# Patient Record
Sex: Female | Born: 1980 | Race: White | Hispanic: No | Marital: Married | Smoking: Current some day smoker
Health system: Southern US, Community
[De-identification: ages and names within clinical notes are randomized; demographics above are authoritative.]

## PROBLEM LIST (undated history)

## (undated) DIAGNOSIS — B009 Herpesviral infection, unspecified: Secondary | ICD-10-CM

## (undated) DIAGNOSIS — D249 Benign neoplasm of unspecified breast: Secondary | ICD-10-CM

## (undated) DIAGNOSIS — R87629 Unspecified abnormal cytological findings in specimens from vagina: Secondary | ICD-10-CM

## (undated) DIAGNOSIS — R61 Generalized hyperhidrosis: Secondary | ICD-10-CM

## (undated) DIAGNOSIS — R87619 Unspecified abnormal cytological findings in specimens from cervix uteri: Secondary | ICD-10-CM

## (undated) DIAGNOSIS — Z1371 Encounter for nonprocreative screening for genetic disease carrier status: Secondary | ICD-10-CM

## (undated) DIAGNOSIS — Z9189 Other specified personal risk factors, not elsewhere classified: Secondary | ICD-10-CM

## (undated) DIAGNOSIS — K219 Gastro-esophageal reflux disease without esophagitis: Secondary | ICD-10-CM

## (undated) DIAGNOSIS — Z803 Family history of malignant neoplasm of breast: Secondary | ICD-10-CM

## (undated) HISTORY — DX: Generalized hyperhidrosis: R61

## (undated) HISTORY — DX: Other specified personal risk factors, not elsewhere classified: Z91.89

## (undated) HISTORY — DX: Unspecified abnormal cytological findings in specimens from cervix uteri: R87.619

## (undated) HISTORY — DX: Herpesviral infection, unspecified: B00.9

## (undated) HISTORY — DX: Family history of malignant neoplasm of breast: Z80.3

## (undated) HISTORY — DX: Benign neoplasm of unspecified breast: D24.9

## (undated) HISTORY — DX: Encounter for nonprocreative screening for genetic disease carrier status: Z13.71

---

## 2005-12-07 ENCOUNTER — Ambulatory Visit: Payer: Self-pay

## 2006-10-31 ENCOUNTER — Ambulatory Visit: Payer: Self-pay | Admitting: Gynecologic Oncology

## 2007-01-14 ENCOUNTER — Emergency Department: Payer: Self-pay | Admitting: Emergency Medicine

## 2008-01-14 ENCOUNTER — Inpatient Hospital Stay: Payer: Self-pay

## 2012-07-11 DIAGNOSIS — Z1371 Encounter for nonprocreative screening for genetic disease carrier status: Secondary | ICD-10-CM

## 2012-07-11 HISTORY — DX: Encounter for nonprocreative screening for genetic disease carrier status: Z13.71

## 2013-11-29 ENCOUNTER — Ambulatory Visit: Payer: Self-pay

## 2013-12-06 ENCOUNTER — Encounter: Payer: Self-pay | Admitting: General Surgery

## 2013-12-25 ENCOUNTER — Other Ambulatory Visit: Payer: 59

## 2013-12-25 ENCOUNTER — Encounter: Payer: Self-pay | Admitting: General Surgery

## 2013-12-25 ENCOUNTER — Ambulatory Visit (INDEPENDENT_AMBULATORY_CARE_PROVIDER_SITE_OTHER): Payer: 59 | Admitting: General Surgery

## 2013-12-25 VITALS — BP 120/68 | HR 74 | Resp 12 | Ht 68.0 in | Wt 141.0 lb

## 2013-12-25 DIAGNOSIS — N63 Unspecified lump in unspecified breast: Secondary | ICD-10-CM

## 2013-12-25 HISTORY — PX: BREAST BIOPSY: SHX20

## 2013-12-25 NOTE — Patient Instructions (Signed)
Patient to return in 1 week for nurse visit. 6 month follow up with MD. The patient is aware to call back for any questions or concerns.     CARE AFTER BREAST BIOPSY  1. Leave the dressing on that your doctor applied after surgery. It is waterproof. You may bathe, shower and/or swim. The dressing will probably remain intact until your return office visit. If the dressing comes off, you will see Abhay Godbolt strips of tape against your skin on the incision. Do not remove these strips.  2. You may want to use a gauze,cloth or similar protection in your bra to prevent rubbing against your dressing and incision. This is not necessary, but you may feel more comfortable doing so.  3. It is recommended that you wear a bra day and night to give support to the breast. This will prevent the weight of the breast from pulling on the incision.  4. Your breast will feel hard and lumpy under the incision. Do not be alarmed. This is the underlying stitching of tissue. Softening of this tissue will occur in time.  5. Make sure you call the office and schedule an appointment in one week after your surgery. The office phone number is [phone]. The nurses [facility] may have already done this for you.  6. You will notice about a week after your office visit that the strips of the tape on your incision will begin to loosen. These may then be removed.  7. Report to your doctor any of the following:  * Severe pain not relieved by your pain medication  *Redness of the incision  * Drainage from the incision  *Fever greater than 101 degrees

## 2013-12-25 NOTE — Progress Notes (Signed)
Patient ID: Lynn Nguyen, female   DOB: 04-Jul-1981, 33 y.o.   MRN: [id number]  Chief Complaint  Patient presents with  . Other    Abnormal mammogram     HPI Lynn Nguyen is a 33 y.o. female who presents for a breast evaluation. The most recent mammogram and right breast ultrasound was done on 11/29/13. Patient does perform regular self breast checks. This was her first mammogram done. She has a family history of breast cancer. She noticed the lump approximately 1 month ago . She states her breast are tender and the area has stayed the same size since first noticing it.    HPI  History reviewed. No pertinent past medical history.  History reviewed. No pertinent past surgical history.  Family History  Problem Relation Age of Onset  . Cancer Maternal Grandmother 6    breast  . Cancer Paternal Grandmother     ovarian    Social History History  Substance Use Topics  . Smoking status: Current Every Day Smoker -- 1.00 packs/day for 10 years    Types: Cigarettes  . Smokeless tobacco: Not on file  . Alcohol Use: No    No Known Allergies  No current outpatient prescriptions on file.   No current facility-administered medications for this visit.    Review of Systems Review of Systems  Constitutional: Negative.   Respiratory: Negative.   Cardiovascular: Negative.     Blood pressure 120/68, pulse 74, resp. rate 12, height 5\' 8"  (1.727 m), weight 141 lb (63.957 kg), last menstrual period 11/22/2013.  Physical Exam Physical Exam  Constitutional: She is oriented to person, place, and time. She appears well-developed and well-nourished.  Eyes: Conjunctivae are normal. No scleral icterus.  Neck: Neck supple. No thyromegaly present.  Pulmonary/Chest: Right breast exhibits mass (1 cm mobile mass located at 1 o'clock smooth 3 cm from nipple.). Right breast exhibits no inverted nipple, no nipple discharge, no skin change and no tenderness. Left breast exhibits no inverted  nipple, no mass, no nipple discharge, no skin change and no tenderness.  Lymphadenopathy:    She has no cervical adenopathy.    She has no axillary adenopathy.  Neurological: She is alert and oriented to person, place, and time.  Skin: Skin is warm and dry.    Data Reviewed Mammogram-nl, dense breasts. [place]- hypoechoic mass, likely a fibroadenoma.  Assessment    Right breast mass, likely a fibroadenoma.     Plan    Discussed options with pt. She is very concerned because of FH of breast cancer. Agreeable to core biopsy. Completed today.        SANKAR,SEEPLAPUTHUR G 12/25/2013, 12:52 PM

## 2013-12-26 ENCOUNTER — Telehealth: Payer: Self-pay | Admitting: *Deleted

## 2013-12-26 LAB — PATHOLOGY

## 2013-12-26 NOTE — Telephone Encounter (Signed)
Notified patient as instructed, fibroadenoma per Dr Jamal Collin, patient pleased. Discussed follow-up appointments, patient agrees

## 2014-01-01 ENCOUNTER — Ambulatory Visit (INDEPENDENT_AMBULATORY_CARE_PROVIDER_SITE_OTHER): Payer: 59 | Admitting: *Deleted

## 2014-01-01 DIAGNOSIS — N63 Unspecified lump in unspecified breast: Secondary | ICD-10-CM

## 2014-01-01 NOTE — Progress Notes (Signed)
Patient here today for follow up post right breast biopsy.  No dressing.. Minimal bruising noted.  The patient is aware that a heating pad may be used for comfort as needed.  Aware of pathology. Follow up as scheduled.

## 2014-01-01 NOTE — Patient Instructions (Signed)
Continue self breast exams. Call office for any new breast issues or concerns. 

## 2014-05-09 LAB — OB RESULTS CONSOLE GC/CHLAMYDIA
CHLAMYDIA, DNA PROBE: NEGATIVE
CHLAMYDIA, DNA PROBE: NEGATIVE
Chlamydia: NEGATIVE
Chlamydia: NEGATIVE
GC PROBE AMP, GENITAL: NEGATIVE
Gonorrhea: NEGATIVE
Gonorrhea: NEGATIVE
Gonorrhea: NEGATIVE

## 2014-05-09 LAB — OB RESULTS CONSOLE HIV ANTIBODY (ROUTINE TESTING)
HIV: NONREACTIVE
HIV: NONREACTIVE
HIV: NONREACTIVE
HIV: NONREACTIVE
HIV: NONREACTIVE

## 2014-05-09 LAB — OB RESULTS CONSOLE RPR
RPR: NONREACTIVE
RPR: NONREACTIVE
RPR: NONREACTIVE
RPR: NONREACTIVE
RPR: NONREACTIVE

## 2014-05-09 LAB — OB RESULTS CONSOLE RUBELLA ANTIBODY, IGM: Rubella: NON-IMMUNE/NOT IMMUNE

## 2014-05-09 LAB — OB RESULTS CONSOLE VARICELLA ZOSTER ANTIBODY, IGG: Varicella: IMMUNE

## 2014-05-09 LAB — OB RESULTS CONSOLE HEPATITIS B SURFACE ANTIGEN: HEP B S AG: NEGATIVE

## 2014-05-09 LAB — OB RESULTS CONSOLE ABO/RH: RH Type: POSITIVE

## 2014-05-12 ENCOUNTER — Encounter: Payer: Self-pay | Admitting: General Surgery

## 2014-06-25 ENCOUNTER — Ambulatory Visit: Payer: Self-pay | Admitting: General Surgery

## 2014-07-11 NOTE — L&D Delivery Note (Signed)
Delivery Note At 1:40 PM a viable female was delivered via Vaginal, Spontaneous Delivery (Presentation: vertex, LOA) APGAR: 9, 10;  Weight:   8#  9.6 Oz       Grams: [id number]  Placenta status: Intact, Spontaneous.  Cord: 3 vessels with the following complications: none.   Anesthesia: Epidural  Episiotomy: None Lacerations: 1st degree;Periurethral Suture Repair: 2.0 vicryl Est. Blood Loss (mL): 250 Infants name: Naida Sleight   Mom to postpartum.  Baby to Couplet care / Skin to Skin.  SVD of a live viable female infant. After delivery, the infant was placed on the maternal abdomen where the cord was clamped and cut by the fob after pulsation ceased. Apgars were 8/9. The placenta was then delivered via schultz mechanism and was intact with a 3-vessel cord. Pitocin was then started and the uterus was firm with fundal massage. The perineum was then repaired under epidural anesthesia with a 2-0 vicryl and was hemostatic after repair. There was also a periurethral tear that was also repaired with a 2-0 vicryl and was hemostatic. EBL was ~250. Mother and baby are bonding well. Mother is planning to breastfeed.   Louisa Second 11/21/2014, 2:35 PM

## 2014-07-17 ENCOUNTER — Encounter: Payer: Self-pay | Admitting: General Surgery

## 2014-07-17 ENCOUNTER — Ambulatory Visit (INDEPENDENT_AMBULATORY_CARE_PROVIDER_SITE_OTHER): Payer: 59 | Admitting: General Surgery

## 2014-07-17 ENCOUNTER — Other Ambulatory Visit: Payer: 59

## 2014-07-17 VITALS — BP 122/58 | HR 62 | Resp 12 | Ht 68.0 in | Wt 156.0 lb

## 2014-07-17 DIAGNOSIS — D241 Benign neoplasm of right breast: Secondary | ICD-10-CM

## 2014-07-17 DIAGNOSIS — N6310 Unspecified lump in the right breast, unspecified quadrant: Secondary | ICD-10-CM

## 2014-07-17 DIAGNOSIS — Z803 Family history of malignant neoplasm of breast: Secondary | ICD-10-CM

## 2014-07-17 DIAGNOSIS — N63 Unspecified lump in breast: Secondary | ICD-10-CM

## 2014-07-17 NOTE — Progress Notes (Signed)
Patient ID: Lynn Nguyen, female   DOB: Jun 28, 1981, 34 y.o.   MRN: [id number]  Chief Complaint  Patient presents with  . Follow-up    6 month post op right breast biopsy    HPI Lynn Nguyen is a 34 y.o. female who presents for a 6 month post op right breast biopsy showing fibroadenoma. She states she is 5 months pregnant. No new breast issues. The patient had BRCA testing completed at [facility] in May 2014 which was negative. Her sister recently diagnosed with breast cancer-pt unsure of details.   HPI  History reviewed. No pertinent past medical history.  Past Surgical History  Procedure Laterality Date  . Breast biopsy Right 12/25/13    Family History  Problem Relation Age of Onset  . Cancer Maternal Grandmother 60    breast  . Cancer Paternal Grandmother     ovarian  . Cancer Maternal Aunt 28    breast  . Cancer Sister 37    breast    Social History History  Substance Use Topics  . Smoking status: Current Every Day Smoker -- 1.00 packs/day for 10 years    Types: Cigarettes  . Smokeless tobacco: Not on file  . Alcohol Use: No    No Known Allergies  Current Outpatient Prescriptions  Medication Sig Dispense Refill  . Prenatal Vit-Fe Fumarate-FA (MULTIVITAMIN-PRENATAL) 27-0.8 MG TABS tablet Take 1 tablet by mouth daily at 12 noon.     No current facility-administered medications for this visit.    Review of Systems Review of Systems  Constitutional: Negative.   Respiratory: Negative.   Cardiovascular: Negative.     Blood pressure 122/58, pulse 62, resp. rate 12, height 5' 8"  (1.727 m), weight 156 lb (70.761 kg), last menstrual period 01/17/2014.  Physical Exam Physical Exam  Constitutional: She is oriented to person, place, and time. She appears well-developed and well-nourished.  Eyes: Conjunctivae are normal. No scleral icterus.  Neck: Neck supple.  Pulmonary/Chest: Right breast exhibits no inverted nipple, no mass, no nipple discharge,  no skin change and no tenderness. Left breast exhibits no inverted nipple, no mass, no nipple discharge, no skin change and no tenderness.  Lymphadenopathy:    She has no cervical adenopathy.    She has no axillary adenopathy.  Neurological: She is alert and oriented to person, place, and time.  Skin: Skin is warm and dry.    Data Reviewed Previous ultrasound reviewed. Repeat US of right breast shows a lobulated hypoechoic mass right breast 1 ocl 3cm fn. Appears unchanged from before.  Assessment    Fibroadenoma right breast. FH of breast cancer.     Plan    Follow up in 6 mos. Call for any new problems with her breasts.        SANKAR,SEEPLAPUTHUR G 07/17/2014, 2:10 PM

## 2014-07-17 NOTE — Patient Instructions (Signed)
Continue self breast exams. Call office for any new breast issues or concerns. 

## 2014-10-24 LAB — OB RESULTS CONSOLE GBS: GBS: NEGATIVE

## 2014-11-20 ENCOUNTER — Inpatient Hospital Stay
Admission: EM | Admit: 2014-11-20 | Discharge: 2014-11-22 | DRG: 774 | Disposition: A | Discharge status: Home / Self Care | Payer: 59 | Attending: Obstetrics & Gynecology | Admitting: Obstetrics & Gynecology

## 2014-11-20 DIAGNOSIS — O9852 Other viral diseases complicating childbirth: Secondary | ICD-10-CM | POA: Diagnosis present

## 2014-11-20 DIAGNOSIS — Z87891 Personal history of nicotine dependence: Secondary | ICD-10-CM | POA: Diagnosis not present

## 2014-11-20 DIAGNOSIS — B009 Herpesviral infection, unspecified: Secondary | ICD-10-CM | POA: Diagnosis present

## 2014-11-20 DIAGNOSIS — Z3A40 40 weeks gestation of pregnancy: Secondary | ICD-10-CM | POA: Diagnosis present

## 2014-11-20 DIAGNOSIS — Z3493 Encounter for supervision of normal pregnancy, unspecified, third trimester: Secondary | ICD-10-CM

## 2014-11-20 DIAGNOSIS — Z3483 Encounter for supervision of other normal pregnancy, third trimester: Secondary | ICD-10-CM | POA: Diagnosis present

## 2014-11-20 DIAGNOSIS — Z3490 Encounter for supervision of normal pregnancy, unspecified, unspecified trimester: Secondary | ICD-10-CM

## 2014-11-20 DIAGNOSIS — O480 Post-term pregnancy: Principal | ICD-10-CM | POA: Diagnosis present

## 2014-11-20 HISTORY — DX: Unspecified abnormal cytological findings in specimens from vagina: R87.629

## 2014-11-20 MED ORDER — LACTATED RINGERS IV SOLN
INTRAVENOUS | Status: DC
  Administered 2014-11-20: 23:00:00 via INTRAVENOUS
  Administered 2014-11-21: 125 mL/h via INTRAVENOUS

## 2014-11-20 MED ORDER — ONDANSETRON HCL 4 MG/2ML IJ SOLN: 4.0000 mg | Freq: Four times a day (QID) | INTRAMUSCULAR | Status: DC | PRN

## 2014-11-20 MED ORDER — ZOLPIDEM TARTRATE 5 MG PO TABS: 5.0000 mg | ORAL_TABLET | Freq: Every evening | ORAL | Status: DC | PRN

## 2014-11-20 MED ORDER — ACETAMINOPHEN 325 MG PO TABS: 650.0000 mg | ORAL_TABLET | ORAL | Status: DC | PRN

## 2014-11-20 MED ORDER — OXYTOCIN BOLUS FROM INFUSION: 500.0000 mL | INTRAVENOUS | Status: DC

## 2014-11-20 MED ORDER — TERBUTALINE SULFATE 1 MG/ML IJ SOLN: 0.2500 mg | Freq: Once | INTRAMUSCULAR | Status: AC | PRN

## 2014-11-20 MED ORDER — LIDOCAINE HCL (PF) 1 % IJ SOLN
30.0000 mL | INTRAMUSCULAR | Status: AC | PRN
  Administered 2014-11-21: 3 mL via SUBCUTANEOUS

## 2014-11-20 MED ORDER — LACTATED RINGERS IV SOLN: 500.0000 mL | INTRAVENOUS | Status: DC | PRN

## 2014-11-20 MED ORDER — BUTORPHANOL TARTRATE 1 MG/ML IJ SOLN: 1.0000 mg | INTRAMUSCULAR | Status: DC | PRN

## 2014-11-20 MED ORDER — CITRIC ACID-SODIUM CITRATE 334-500 MG/5ML PO SOLN: 30.0000 mL | ORAL | Status: DC | PRN

## 2014-11-20 MED ORDER — DINOPROSTONE 10 MG VA INST
10.0000 mg | VAGINAL_INSERT | Freq: Once | VAGINAL | Status: AC
  Administered 2014-11-20: 10 mg via VAGINAL
  Filled 2014-11-20: qty 1

## 2014-11-20 MED ORDER — OXYTOCIN 40 UNITS IN LACTATED RINGERS INFUSION - SIMPLE MED: 62.5000 mL/h | INTRAVENOUS | Status: DC

## 2014-11-20 NOTE — H&P (Signed)
OB History & Physical   History of Present Illness:  Chief Complaint:   HPI:  Lynn Nguyen is a 34 y.o. G2P1 female at [redacted]w[redacted]d dated by 1st trimester ultrasound.  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  She presents to L&D for induction of labor for past due date.    Rare CTX, no VB, no LOF and ++FM  Maternal Medical History:   Past Medical History  Diagnosis Date  . Vaginal Pap smear, abnormal     Past Surgical History  Procedure Laterality Date  . Breast biopsy Right 12/25/13    No Known Allergies  Prior to Admission medications   Medication Sig Start Date End Date Taking? Authorizing Provider  Prenatal Vit-Fe Fumarate-FA (MULTIVITAMIN-PRENATAL) 27-0.8 MG TABS tablet Take 1 tablet by mouth daily at 12 noon.   Yes Historical Provider, MD  valACYclovir (VALTREX) 500 MG tablet Take 500 mg by mouth 2 (two) times daily.   Yes Historical Provider, MD    OB History  Gravida Para Term Preterm AB SAB TAB Ectopic Multiple Living  2 1        1     # Outcome Date GA Lbr Len/2nd Weight Sex Delivery Anes PTL Lv  2 Current           1 Para             Obstetric Comments  1st Menstrual Cycle:  14  1st Pregnancy:  27    [facility]ren[facility] care [facility]: [facility]  Social History: She  reports that she has quit smoking. Her smoking use included Cigarettes. She has a 10 pack-year smoking history. She does not have any smokeless tobacco history on file. She reports that she does not drink alcohol or use illicit drugs.  Family History: family history includes Cancer in her paternal grandmother; Cancer (age of onset: 38) in her sister; Cancer (age of onset: 100) in her maternal grandmother; Cancer (age of onset: 51) in her maternal aunt.   Review of Systems: Negative x 10 systems reviewed except as noted in the HPI.     Physical Exam:  Vital Signs: BP 107/73 mmHg  Pulse 95  Temp(Src) 97.9 F (36.6 C) (Oral)  Resp 18  Ht 5\' 7"  (1.702 m)  Wt 81.194 kg (179 lb)   BMI 28.03 kg/m2  LMP 01/17/2014 General: no acute distress.  HEENT: normocephalic, atraumatic Heart: regular rate & rhythm.  No murmurs/rubs/gallops Lungs: clear to auscultation bilaterally Abdomen: soft, gravid, non-tender Pelvic:   External: Normal external female genitalia  Cervix: Dilation: 1.5 / Effacement (%): 50 / Station: -3  Extremities: non-tender, symmetric, 1+ edema bilaterally.  DTRs: 2+ Neurologic: Alert & oriented x 3.    Pertinent Results:  Prenatal Labs: Blood type/Rh A+  Antibody screen neg  Rubella Non-immune, varicella immune  RPR Non-reactive  HBsAg neg  HIV neg  GC neg  Chlamydia neg  Genetic screening   1 hour GTT   3 hour GTT   GBS neg   Baseline FHR: 125 beats    Variability: moderate  Accelerations: present  Decelerations: absent Contractions: present frequency: rare Overall assessment: category 1   Assessment:  Lynn Nguyen is a 34 y.o. G2P1 female at [redacted]w[redacted]d with IOL for past due date.   Plan:  1. Admit to Labor & Delivery - notify attending & anesthesia.   2. CBC, T&S, Clrs, IVF 3. GBS neg 4. Consents obtained. 5. Cervidil induction  Stepheni Cameron, Honor Loh  11/20/2014 10:37 PM

## 2014-11-21 ENCOUNTER — Inpatient Hospital Stay: Payer: 59 | Admitting: Registered Nurse

## 2014-11-21 ENCOUNTER — Encounter: Payer: Self-pay | Admitting: *Deleted

## 2014-11-21 LAB — TYPE AND SCREEN
ABO/RH(D): A POS
Antibody Screen: NEGATIVE

## 2014-11-21 LAB — CBC
HCT: 35 % (ref 35.0–47.0)
Hemoglobin: 11.9 g/dL — ABNORMAL LOW (ref 12.0–16.0)
MCH: 32.7 pg (ref 26.0–34.0)
MCHC: 34 g/dL (ref 32.0–36.0)
MCV: 96.2 fL (ref 80.0–100.0)
PLATELETS: 253 10*3/uL (ref 150–440)
RBC: 3.64 MIL/uL — AB (ref 3.80–5.20)
RDW: 13.3 % (ref 11.5–14.5)
WBC: 10.8 10*3/uL (ref 3.6–11.0)

## 2014-11-21 LAB — ABO/RH: ABO/RH(D): A POS

## 2014-11-21 MED ORDER — LANOLIN HYDROUS EX OINT: TOPICAL_OINTMENT | CUTANEOUS | Status: DC | PRN

## 2014-11-21 MED ORDER — BUPIVACAINE HCL (PF) 0.25 % IJ SOLN
INTRAMUSCULAR | Status: AC | PRN
  Administered 2014-11-21 (×2): 4 mL

## 2014-11-21 MED ORDER — FENTANYL 2.5 MCG/ML W/ROPIVACAINE 0.2% IN NS 100 ML EPIDURAL INFUSION (ARMC-ANES)
9.0000 mL/h | EPIDURAL | Status: DC
  Administered 2014-11-21: 9 mL/h via EPIDURAL

## 2014-11-21 MED ORDER — MEASLES, MUMPS & RUBELLA VAC ~~LOC~~ INJ
0.5000 mL | INJECTION | SUBCUTANEOUS | Status: AC | PRN
  Administered 2014-11-22: 0.5 mL via SUBCUTANEOUS
  Filled 2014-11-21: qty 0.5

## 2014-11-21 MED ORDER — WITCH HAZEL-GLYCERIN EX PADS: 1.0000 "application " | MEDICATED_PAD | CUTANEOUS | Status: DC | PRN

## 2014-11-21 MED ORDER — EPHEDRINE 5 MG/ML INJ
10.0000 mg | INTRAVENOUS | Status: DC | PRN
  Filled 2014-11-21: qty 2

## 2014-11-21 MED ORDER — IBUPROFEN 600 MG PO TABS
600.0000 mg | ORAL_TABLET | Freq: Four times a day (QID) | ORAL | Status: DC
  Administered 2014-11-22 (×3): 600 mg via ORAL
  Filled 2014-11-21 (×3): qty 1

## 2014-11-21 MED ORDER — BENZOCAINE-MENTHOL 20-0.5 % EX AERO: 1.0000 "application " | INHALATION_SPRAY | CUTANEOUS | Status: DC | PRN

## 2014-11-21 MED ORDER — ZOLPIDEM TARTRATE 5 MG PO TABS: 5.0000 mg | ORAL_TABLET | Freq: Every evening | ORAL | Status: DC | PRN

## 2014-11-21 MED ORDER — PRENATAL MULTIVITAMIN CH
1.0000 | ORAL_TABLET | Freq: Every day | ORAL | Status: DC
  Administered 2014-11-22: 1 via ORAL
  Filled 2014-11-21: qty 1

## 2014-11-21 MED ORDER — SENNOSIDES-DOCUSATE SODIUM 8.6-50 MG PO TABS: 2.0000 | ORAL_TABLET | ORAL | Status: DC

## 2014-11-21 MED ORDER — BENZOCAINE-MENTHOL 20-0.5 % EX AERO
INHALATION_SPRAY | CUTANEOUS | Status: AC
  Filled 2014-11-21: qty 56

## 2014-11-21 MED ORDER — OXYTOCIN 40 UNITS IN LACTATED RINGERS INFUSION - SIMPLE MED: 62.5000 mL/h | Freq: Once | INTRAVENOUS | Status: DC

## 2014-11-21 MED ORDER — ONDANSETRON HCL 4 MG PO TABS: 4.0000 mg | ORAL_TABLET | ORAL | Status: DC | PRN

## 2014-11-21 MED ORDER — FENTANYL 2.5 MCG/ML W/ROPIVACAINE 0.2% IN NS 100 ML EPIDURAL INFUSION (ARMC-ANES)
EPIDURAL | Status: AC
  Filled 2014-11-21: qty 100

## 2014-11-21 MED ORDER — OXYCODONE-ACETAMINOPHEN 5-325 MG PO TABS: 1.0000 | ORAL_TABLET | ORAL | Status: DC | PRN

## 2014-11-21 MED ORDER — PHENYLEPHRINE 40 MCG/ML (10ML) SYRINGE FOR IV PUSH (FOR BLOOD PRESSURE SUPPORT)
80.0000 ug | PREFILLED_SYRINGE | INTRAVENOUS | Status: DC | PRN
  Filled 2014-11-21: qty 2

## 2014-11-21 MED ORDER — OXYCODONE-ACETAMINOPHEN 5-325 MG PO TABS: 2.0000 | ORAL_TABLET | ORAL | Status: DC | PRN

## 2014-11-21 MED ORDER — TETANUS-DIPHTH-ACELL PERTUSSIS 5-2.5-18.5 LF-MCG/0.5 IM SUSP: 0.5000 mL | Freq: Once | INTRAMUSCULAR | Status: DC

## 2014-11-21 MED ORDER — DIPHENHYDRAMINE HCL 25 MG PO CAPS: 25.0000 mg | ORAL_CAPSULE | Freq: Four times a day (QID) | ORAL | Status: DC | PRN

## 2014-11-21 MED ORDER — SENNOSIDES-DOCUSATE SODIUM 8.6-50 MG PO TABS
ORAL_TABLET | ORAL | Status: AC
Start: 2014-11-21 — End: 2014-11-22
  Filled 2014-11-21: qty 2

## 2014-11-21 MED ORDER — ACETAMINOPHEN 325 MG PO TABS
650.0000 mg | ORAL_TABLET | ORAL | Status: DC | PRN
  Administered 2014-11-21: 650 mg via ORAL
  Filled 2014-11-21: qty 2

## 2014-11-21 MED ORDER — OXYTOCIN 40 UNITS IN LACTATED RINGERS INFUSION - SIMPLE MED
INTRAVENOUS | Status: AC
  Filled 2014-11-21: qty 1000

## 2014-11-21 MED ORDER — ONDANSETRON HCL 4 MG/2ML IJ SOLN: 4.0000 mg | INTRAMUSCULAR | Status: DC | PRN

## 2014-11-21 MED ORDER — DIPHENHYDRAMINE HCL 50 MG/ML IJ SOLN: 12.5000 mg | INTRAMUSCULAR | Status: DC | PRN

## 2014-11-21 MED ORDER — DIBUCAINE 1 % RE OINT: 1.0000 "application " | TOPICAL_OINTMENT | RECTAL | Status: DC | PRN

## 2014-11-21 MED ORDER — SIMETHICONE 80 MG PO CHEW: 80.0000 mg | CHEWABLE_TABLET | ORAL | Status: DC | PRN

## 2014-11-21 MED ORDER — LIDOCAINE-EPINEPHRINE (PF) 1.5 %-1:200000 IJ SOLN
INTRAMUSCULAR | Status: AC | PRN
  Administered 2014-11-21: 3 mL

## 2014-11-21 NOTE — Anesthesia Preprocedure Evaluation (Signed)
Anesthesia Evaluation  Patient identified by MRN, date of birth, ID band Patient awake    Reviewed: Allergy & Precautions, H&P , NPO status , Patient's Chart, lab work & pertinent test results  History of Anesthesia Complications (+) history of anesthetic complications  Airway Mallampati: II  TM Distance: >3 FB     Dental no notable dental hx.    Pulmonary former smoker,    Pulmonary exam normal       Cardiovascular negative cardio ROS Normal cardiovascular exam    Neuro/Psych negative neurological ROS  negative psych ROS   GI/Hepatic negative GI ROS, Neg liver ROS,   Endo/Other  negative endocrine ROS  Renal/GU negative Renal ROS  negative genitourinary   Musculoskeletal   Abdominal   Peds  Hematology negative hematology ROS (+)   Anesthesia Other Findings   Reproductive/Obstetrics (+) Pregnancy                             Anesthesia Physical Anesthesia Plan  ASA: II  Anesthesia Plan: Epidural   Post-op Pain Management:    Induction:   Airway Management Planned:   Additional Equipment:   Intra-op Plan:   Post-operative Plan:   Informed Consent:   Plan Discussed with: Anesthesiologist  Anesthesia Plan Comments:         Anesthesia Quick Evaluation

## 2014-11-21 NOTE — Anesthesia Procedure Notes (Addendum)
Epidural Patient location during procedure: OB Start time: 11/21/2014 11:59 AM End time: 11/21/2014 12:09 PM  Staffing Resident/CRNA: Doreen Salvage Performed by: resident/CRNA   Preanesthetic Checklist Completed: patient identified, surgical consent, pre-op evaluation, timeout performed, IV checked, risks and benefits discussed and monitors and equipment checked  Epidural Patient position: sitting Prep: Betadine and 1202 Patient monitoring: continuous pulse ox and blood pressure Approach: midline Location: L3-L4 Injection technique: LOR saline  Needle:  Needle type: Tuohy  Needle gauge: 18 G Needle length: 9 cm Needle insertion depth: 5.5 cm Catheter type: closed end flexible Catheter size: 20 Guage Catheter at skin depth: 10 cm Test dose: negative and 1.5% lidocaine with Epi 1:200 K  Assessment Sensory level: T10 Events: blood not aspirated, injection not painful, no injection resistance, negative IV test and no paresthesia  Additional Notes Reason for block:procedure for pain

## 2014-11-22 LAB — CBC
HEMATOCRIT: 33.5 % — AB (ref 35.0–47.0)
HEMOGLOBIN: 11.5 g/dL — AB (ref 12.0–16.0)
MCH: 32.7 pg (ref 26.0–34.0)
MCHC: 34.4 g/dL (ref 32.0–36.0)
MCV: 95 fL (ref 80.0–100.0)
PLATELETS: 243 10*3/uL (ref 150–440)
RBC: 3.52 MIL/uL — AB (ref 3.80–5.20)
RDW: 13.9 % (ref 11.5–14.5)
WBC: 20.7 10*3/uL — ABNORMAL HIGH (ref 3.6–11.0)

## 2014-11-22 LAB — HIV ANTIBODY (ROUTINE TESTING W REFLEX): HIV Screen 4th Generation wRfx: NONREACTIVE

## 2014-11-22 LAB — RPR: RPR Ser Ql: NONREACTIVE

## 2014-11-22 MED ORDER — MEDROXYPROGESTERONE ACETATE 150 MG/ML IM SUSP
150.0000 mg | Freq: Once | INTRAMUSCULAR | Status: AC
  Administered 2014-11-22: 150 mg via INTRAMUSCULAR
  Filled 2014-11-22: qty 1

## 2014-11-22 MED ORDER — TETANUS-DIPHTH-ACELL PERTUSSIS 5-2.5-18.5 LF-MCG/0.5 IM SUSP
0.5000 mL | Freq: Once | INTRAMUSCULAR | Status: DC
Start: 2014-11-23 — End: 2014-11-22

## 2014-11-22 MED ORDER — IBUPROFEN 600 MG PO TABS: 600.0000 mg | ORAL_TABLET | Freq: Four times a day (QID) | ORAL | Status: DC

## 2014-11-22 NOTE — Discharge Summary (Signed)
Obstetric Discharge Summary Reason for Admission: induction of labor Intrapartum Procedures: spontaneous vaginal delivery Postpartum Procedures: none Complications-Operative and Postpartum: none HEMOGLOBIN  Date Value Ref Range Status  11/22/2014 11.5* 12.0 - 16.0 g/dL Final   HCT  Date Value Ref Range Status  11/22/2014 33.5* 35.0 - 47.0 % Final    Physical Exam:  General: alert Lochia: appropriate Uterine Fundus: firm Incision: laceration healing well DVT Evaluation: No evidence of DVT seen on physical exam. Abdomen: abdomen is soft without significant tenderness, masses, organomegaly or guarding  Discharge Diagnoses: Term Pregnancy-delivered  Discharge Information: Date: 11/22/2014 Activity: unrestricted Diet: routine Medications: PNV and Ibuprofen Condition: stable Instructions: refer to practice specific booklet Discharge to: home Follow-up Information    Follow up with Louisa Second, CNM. Schedule an appointment as soon as possible for a visit in 6 weeks.   Specialty:  Certified Nurse Midwife   Why:  postpartum visit   Contact information:   [place] [address] [phone]       Newborn Data: Live born female  Birth Weight: 8 lb 9.6 oz (3900 g) APGAR: 9, 10  Home with mother.  Lynn Nguyen, [place] 11/22/2014, 2:01 PM

## 2014-11-22 NOTE — Progress Notes (Signed)
pt discharged home with infant.  Discharge instructions, prescriptions and follow up appointment given to and reviewed with pt.  Pt verbalized understanding, all questions answered.  Escorted by auxiliary. 

## 2014-11-22 NOTE — Progress Notes (Signed)
Fresh ice pack for swelling

## 2014-11-22 NOTE — Discharge Instructions (Signed)
Discharge instructions:   Call office if you have any of the following: headache, visual changes, fever >100 F, chills, breast concerns, excessive vaginal bleeding, incision drainage or problems, leg pain or redness, depression or any other concerns.   Activity: Do not lift > 10 lbs for 6 weeks.  No intercourse or tampons for 6 weeks.  No driving for 1-2 weeks.

## 2015-01-22 ENCOUNTER — Ambulatory Visit: Payer: 59 | Admitting: General Surgery

## 2015-07-12 DIAGNOSIS — Z9189 Other specified personal risk factors, not elsewhere classified: Secondary | ICD-10-CM

## 2015-07-12 HISTORY — DX: Other specified personal risk factors, not elsewhere classified: Z91.89

## 2016-03-07 ENCOUNTER — Encounter: Payer: Self-pay | Admitting: *Deleted

## 2016-03-10 ENCOUNTER — Encounter: Payer: Self-pay | Admitting: General Surgery

## 2016-03-10 ENCOUNTER — Ambulatory Visit (INDEPENDENT_AMBULATORY_CARE_PROVIDER_SITE_OTHER): Payer: 59 | Admitting: General Surgery

## 2016-03-10 ENCOUNTER — Other Ambulatory Visit: Payer: Self-pay

## 2016-03-10 VITALS — BP 104/62 | HR 86 | Resp 14 | Ht 67.0 in | Wt 177.0 lb

## 2016-03-10 DIAGNOSIS — Z803 Family history of malignant neoplasm of breast: Secondary | ICD-10-CM

## 2016-03-10 DIAGNOSIS — D241 Benign neoplasm of right breast: Secondary | ICD-10-CM | POA: Diagnosis not present

## 2016-03-10 NOTE — Patient Instructions (Addendum)
The patient is aware to call back for any questions or concerns. Patient will be asked to return to the office in one year with a bilateral screening mammogram and office ultrasound.

## 2016-03-10 NOTE — Progress Notes (Signed)
Patient ID: Lynn Nguyen, female   DOB: Jan 20, 1981, 35 y.o.   MRN: [id number]  Chief Complaint  Patient presents with  . Follow-up    HPI PHYLLISS Nguyen is a 35 y.o. female.  Here today for follow up right breast biopsy done last year. No new breast issues. Occasional mild tenderness at biopsy site but it goes away. The patient had BRCA testing completed at [facility] in May 2014 which was negative I have reviewed the history of present illness with the patient.  HPI  Past Medical History:  Diagnosis Date  . BRCA negative 2014  . Vaginal Pap smear, abnormal     Past Surgical History:  Procedure Laterality Date  . BREAST BIOPSY Right 12/25/13    Family History  Problem Relation Age of Onset  . Breast cancer Maternal Grandmother 60    breast  . Ovarian cancer Paternal Grandmother     ovarian  . Breast cancer Maternal Aunt 12    breast  . Breast cancer Sister 82    breast    Social History Social History  Substance Use Topics  . Smoking status: Former Smoker    Packs/day: 1.00    Years: 10.00    Types: Cigarettes  . Smokeless tobacco: Never Used  . Alcohol use No    No Known Allergies  Current Outpatient Prescriptions  Medication Sig Dispense Refill  . ibuprofen (ADVIL,MOTRIN) 600 MG tablet Take 1 tablet (600 mg total) by mouth every 6 (six) hours. (Patient taking differently: Take 600 mg by mouth every 6 (six) hours as needed. ) 30 tablet 0   No current facility-administered medications for this visit.    Facility-Administered Medications Ordered in Other Visits  Medication Dose Route Frequency Provider Last Rate Last Dose  . bupivacaine (PF) (MARCAINE) 0.25 % injection    Anesthesia Intra-op Doreen Salvage, CRNA   4 mL at 11/21/14 1218  . lidocaine-EPINEPHrine 1.5 %-1:200000 injection    Anesthesia Intra-op Doreen Salvage, CRNA   3 mL at 11/21/14 1210    Review of Systems Review of Systems  Constitutional: Negative.   Respiratory: Negative.    Cardiovascular: Negative.     Blood pressure 104/62, pulse 86, resp. rate 14, height 5' 7"  (1.702 m), weight 177 lb (80.3 kg), unknown if currently breastfeeding.  Physical Exam Physical Exam  Constitutional: She is oriented to person, place, and time. She appears well-developed and well-nourished.  HENT:  Mouth/Throat: Oropharynx is clear and moist.  Eyes: Conjunctivae are normal. No scleral icterus.  Neck: Neck supple.  Cardiovascular: Normal rate, regular rhythm and normal heart sounds.   Pulmonary/Chest: Effort normal and breath sounds normal. Right breast exhibits no inverted nipple, no mass, no nipple discharge, no skin change and no tenderness. Left breast exhibits no inverted nipple, no mass, no nipple discharge, no skin change and no tenderness.  Abdominal: Soft. Normal appearance. There is no tenderness.  Lymphadenopathy:    She has no cervical adenopathy.    She has no axillary adenopathy.  Neurological: She is alert and oriented to person, place, and time.  Skin: Skin is warm and dry.  Psychiatric: Her behavior is normal.    Data Reviewed Progress notes. [place] of right breast at 1ocl 4cm from nipple performed. Prior mass is again seen with max size of 1.9cm, was 1.8 before. Assessment    Fibroadenoma right breast. Essentially unchanged in size.  FH of breast cancer. BRCA negative    Plan    Patient will be asked  to return to the office in one year with a bilateral screening mammogram and office ultrasound.       This information has been scribed by Lynn Fetch RN, BSN,BC.  SANKAR,Lynn Nguyen 03/16/2016, 7:52 AM

## 2016-03-16 ENCOUNTER — Encounter: Payer: Self-pay | Admitting: General Surgery

## 2016-09-16 ENCOUNTER — Ambulatory Visit (INDEPENDENT_AMBULATORY_CARE_PROVIDER_SITE_OTHER): Payer: 59

## 2016-09-16 DIAGNOSIS — Z308 Encounter for other contraceptive management: Secondary | ICD-10-CM

## 2016-09-16 MED ORDER — MEDROXYPROGESTERONE ACETATE 150 MG/ML IM SUSP
150.0000 mg | Freq: Once | INTRAMUSCULAR | Status: AC
  Administered 2016-09-16: 150 mg via INTRAMUSCULAR

## 2016-09-16 NOTE — Progress Notes (Signed)
Pt came in to office today for Depo Provera injection. Last injection was 07/01/2016.

## 2016-12-09 ENCOUNTER — Ambulatory Visit (INDEPENDENT_AMBULATORY_CARE_PROVIDER_SITE_OTHER): Payer: 59

## 2016-12-09 DIAGNOSIS — Z3042 Encounter for surveillance of injectable contraceptive: Secondary | ICD-10-CM | POA: Diagnosis not present

## 2016-12-09 MED ORDER — MEDROXYPROGESTERONE ACETATE 150 MG/ML IM SUSP
150.0000 mg | Freq: Once | INTRAMUSCULAR | Status: AC
  Administered 2016-12-09: 150 mg via INTRAMUSCULAR

## 2016-12-22 ENCOUNTER — Other Ambulatory Visit: Payer: Self-pay

## 2016-12-22 DIAGNOSIS — Z1231 Encounter for screening mammogram for malignant neoplasm of breast: Secondary | ICD-10-CM

## 2017-02-17 ENCOUNTER — Ambulatory Visit
Admission: RE | Admit: 2017-02-17 | Discharge: 2017-02-17 | Disposition: A | Discharge status: Home / Self Care | Payer: 59 | Source: Ambulatory Visit | Attending: General Surgery | Admitting: General Surgery

## 2017-02-17 DIAGNOSIS — Z1231 Encounter for screening mammogram for malignant neoplasm of breast: Secondary | ICD-10-CM | POA: Diagnosis not present

## 2017-02-23 ENCOUNTER — Encounter: Payer: Self-pay | Admitting: General Surgery

## 2017-02-23 ENCOUNTER — Inpatient Hospital Stay: Payer: Self-pay

## 2017-02-23 ENCOUNTER — Ambulatory Visit (INDEPENDENT_AMBULATORY_CARE_PROVIDER_SITE_OTHER): Payer: 59 | Admitting: General Surgery

## 2017-02-23 VITALS — BP 106/60 | HR 68 | Resp 14 | Ht 67.0 in | Wt 178.8 lb

## 2017-02-23 DIAGNOSIS — Z803 Family history of malignant neoplasm of breast: Secondary | ICD-10-CM | POA: Diagnosis not present

## 2017-02-23 DIAGNOSIS — D241 Benign neoplasm of right breast: Secondary | ICD-10-CM | POA: Diagnosis not present

## 2017-02-23 NOTE — Progress Notes (Signed)
Patient ID: Lynn Nguyen, female   DOB: 1981-01-10, 36 y.o.   MRN: [id number]  Chief Complaint  Patient presents with  . Follow-up    mammogram    HPI Lynn Nguyen is a 36 y.o. female who presents for a breast evaluation. The most recent mammogram was done on 02/17/17. Patient does perform regular self breast checks and gets regular mammograms done. She does report some stinging sensation in her right breast at her previous biopsy site that occurs sometimes.    HPI  Past Medical History:  Diagnosis Date  . BRCA negative 2014  . Vaginal Pap smear, abnormal     Past Surgical History:  Procedure Laterality Date  . BREAST BIOPSY Right 12/25/13   neg    Family History  Problem Relation Age of Onset  . Breast cancer Maternal Grandmother 60       breast  . Ovarian cancer Paternal Grandmother        ovarian  . Breast cancer Maternal Aunt 46       breast  . Breast cancer Sister 72       breast    Social History Social History  Substance Use Topics  . Smoking status: Former Smoker    Packs/day: 1.00    Years: 10.00    Types: Cigarettes  . Smokeless tobacco: Never Used  . Alcohol use No    No Known Allergies  Current Outpatient Prescriptions  Medication Sig Dispense Refill  . ibuprofen (ADVIL,MOTRIN) 600 MG tablet Take 1 tablet (600 mg total) by mouth every 6 (six) hours. (Patient taking differently: Take 600 mg by mouth every 6 (six) hours as needed. ) 30 tablet 0  . MedroxyPROGESTERone Acetate 150 MG/ML SUSY      No current facility-administered medications for this visit.    Facility-Administered Medications Ordered in Other Visits  Medication Dose Route Frequency Provider Last Rate Last Dose  . bupivacaine (PF) (MARCAINE) 0.25 % injection    Anesthesia Intra-op Doreen Salvage, CRNA   4 mL at 11/21/14 1218  . lidocaine-EPINEPHrine 1.5 %-1:200000 injection    Anesthesia Intra-op Doreen Salvage, CRNA   3 mL at 11/21/14 1210    Review of Systems Review of  Systems  Constitutional: Negative.   Respiratory: Negative.   Cardiovascular: Negative.     Blood pressure 106/60, pulse 68, resp. rate 14, height 5' 7"  (1.702 m), weight 178 lb 12.8 oz (81.1 kg), unknown if currently breastfeeding.  Physical Exam Physical Exam  Constitutional: She is oriented to person, place, and time. She appears well-developed and well-nourished.  Eyes: Conjunctivae are normal. No scleral icterus.  Neck: Neck supple.  Cardiovascular: Normal rate, regular rhythm and normal heart sounds.   Pulmonary/Chest: Effort normal and breath sounds normal. Right breast exhibits no inverted nipple, no mass, no nipple discharge, no skin change and no tenderness. Left breast exhibits no inverted nipple, no mass, no nipple discharge, no skin change and no tenderness.  Abdominal: Soft. Normal appearance.  Lymphadenopathy:    She has no cervical adenopathy.    She has no axillary adenopathy.  Neurological: She is alert and oriented to person, place, and time.  Skin: Skin is warm and dry.  Psychiatric: She has a normal mood and affect.    Data Reviewed Mammogram-Cat 1 Previous notes  Right breast ultrasound targeted to the 1:00 location 4 cm from the nipple again shows the hypoechoic mass. Current measurement is at 2.17 cm which is larger than the initial finding of 1.7 Assessment  Fibroadenoma in the right breast which appears to be slowly growing. Family history of breast cancer. Patient is BRCA negative    Plan    Given the slow enlargement of the fibroadenoma excision was recommended and procedure explained to the patient. Patient is agreeable    HPI, Physical Exam, Assessment and Plan have been scribed under the direction and in the presence of Mckinley Jewel, MD  Concepcion Living, LPN I have completed the exam and reviewed the above documentation for accuracy and completeness.  I agree with the above.  Haematologist has been used and any errors in dictation or  transcription are unintentional.  Seanpatrick Maisano G. Jamal Collin, M.D., F.A.C.S.    Junie Panning G 02/27/2017, 8:53 AM  Patient was provided with possible surgery dates for September. She wishes to check her schedule and call the office back to arrange. Surgery instructions were reviewed and provided to the patient today.  Dominga Ferry, CMA

## 2017-02-23 NOTE — Patient Instructions (Addendum)
Schedule breast excisional biopsy

## 2017-03-01 ENCOUNTER — Ambulatory Visit (INDEPENDENT_AMBULATORY_CARE_PROVIDER_SITE_OTHER): Payer: 59

## 2017-03-01 DIAGNOSIS — Z308 Encounter for other contraceptive management: Secondary | ICD-10-CM

## 2017-03-01 MED ORDER — MEDROXYPROGESTERONE ACETATE 150 MG/ML IM SUSP
150.0000 mg | Freq: Once | INTRAMUSCULAR | Status: AC
  Administered 2017-03-01: 150 mg via INTRAMUSCULAR

## 2017-03-01 NOTE — Progress Notes (Signed)
Pt here for depo which was given IM right glut.  NDC# [address]-2 

## 2017-03-08 ENCOUNTER — Other Ambulatory Visit: Payer: Self-pay | Admitting: General Surgery

## 2017-03-08 DIAGNOSIS — D241 Benign neoplasm of right breast: Secondary | ICD-10-CM

## 2017-03-16 ENCOUNTER — Encounter
Admission: RE | Admit: 2017-03-16 | Discharge: 2017-03-16 | Disposition: A | Discharge status: Home / Self Care | Payer: 59 | Source: Ambulatory Visit | Attending: General Surgery | Admitting: General Surgery

## 2017-03-16 HISTORY — DX: Gastro-esophageal reflux disease without esophagitis: K21.9

## 2017-03-17 NOTE — Patient Instructions (Signed)
Your procedure is scheduled on: 03-23-17 Report to Same Day Surgery 2nd floor medical mall [facility] Entrance-take elevator on left to 2nd floor.  Check in with surgery information desk.) To find out your arrival time please call [phone] between 1PM - 3PM on 03-22-17  Remember: Instructions that are not followed completely may result in serious medical risk, up to and including death, or upon the discretion of your surgeon and anesthesiologist your surgery may need to be rescheduled.    _x___ 1. Do not eat food after midnight the night before your procedure. You may drink clear liquids up to 2 hours before you are scheduled to arrive at the hospital for your procedure.  Do not drink clear liquids within 2 hours of your scheduled arrival to the hospital.  Clear liquids include  --Water or Apple juice without pulp  --Clear carbohydrate beverage such as ClearFast or Gatorade  --Black Coffee or Clear Tea (No milk, no creamers, do not add anything to                  the coffee or Tea Type 1 and type 2 diabetics should only drink water.  No gum chewing or hard candies.     __x__ 2. No Alcohol for 24 hours before or after surgery.   __x__3. No Smoking for 24 prior to surgery.   ____  4. Bring all medications with you on the day of surgery if instructed.    __x__ 5. Notify your doctor if there is any change in your medical condition     (cold, fever, infections).     Do not wear jewelry, make-up, hairpins, clips or nail polish.  Do not wear lotions, powders, or perfumes. You may wear deodorant.  Do not shave 48 hours prior to surgery. Men may shave face and neck.  Do not bring valuables to the hospital.    [facility] is not responsible for any belongings or valuables.               Contacts, dentures or bridgework may not be worn into surgery.  Leave your suitcase in the car. After surgery it may be brought to your room.  For patients admitted to the hospital, discharge time is  determined by your                       treatment team.   Patients discharged the day of surgery will not be allowed to drive home.  You will need someone to drive you home and stay with you the night of your procedure.    Please read over the following fact sheets that you were given:   [facility] Preparing for Surgery and or MRSA Information   ____ Take anti-hypertensive listed below, cardiac, seizure, asthma,     anti-reflux and psychiatric medicines. These include:  1. NONE  2.  3.  4.  5.  6.  ____Fleets enema or Magnesium Citrate as directed.   ____ Use CHG Soap or sage wipes as directed on instruction sheet   ____ Use inhalers on the day of surgery and bring to hospital day of surgery  ____ Stop Metformin and Janumet 2 days prior to surgery.    ____ Take 1/2 of usual insulin dose the night before surgery and none on the morning surgery.   ____ Follow recommendations from Cardiologist, Pulmonologist or PCP regarding stopping Aspirin, Coumadin, Plavix ,Eliquis, Effient, or Pradaxa, and Pletal.  X____Stop Anti-inflammatories such  as Advil, Aleve, Ibuprofen, Motrin, Naproxen, Naprosyn, Goodies powders or aspirin products NOW-OK to take Tylenol    ____ Stop supplements until after surgery.     ____ Bring C-Pap to the hospital.

## 2017-03-23 ENCOUNTER — Encounter: Admission: RE | Payer: Self-pay | Source: Ambulatory Visit

## 2017-03-23 ENCOUNTER — Telehealth: Payer: Self-pay | Admitting: *Deleted

## 2017-03-23 ENCOUNTER — Encounter: Payer: Self-pay | Admitting: Certified Registered"

## 2017-03-23 ENCOUNTER — Ambulatory Visit: Admission: RE | Admit: 2017-03-23 | Payer: 59 | Source: Ambulatory Visit | Admitting: General Surgery

## 2017-03-23 SURGERY — BREAST LUMPECTOMY
Anesthesia: Choice | Laterality: Right
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
## 2017-03-27 ENCOUNTER — Telehealth: Payer: Self-pay | Admitting: *Deleted

## 2017-03-27 NOTE — Telephone Encounter (Signed)
Message left for patient to call the office.   Patient's surgery was moved to 04-20-17 per her request. Per Dr. Jamal Collin today, he will be taking vacation and unable to do her surgery on 04-20-17. We need to get surgery rescheduled to a different date.

## 2017-03-28 NOTE — Telephone Encounter (Signed)
Patient called back and has rescheduled her surgery to 05/04/17. She is aware to call the day before her surgery to get her arrival time. The patient is aware of date and instructions.

## 2017-04-10 DIAGNOSIS — D249 Benign neoplasm of unspecified breast: Secondary | ICD-10-CM

## 2017-04-10 HISTORY — DX: Benign neoplasm of unspecified breast: D24.9

## 2017-04-13 ENCOUNTER — Other Ambulatory Visit: Payer: 59

## 2017-04-27 ENCOUNTER — Inpatient Hospital Stay: Admission: RE | Admit: 2017-04-27 | Payer: 59 | Source: Ambulatory Visit

## 2017-04-28 ENCOUNTER — Inpatient Hospital Stay: Admission: RE | Admit: 2017-04-28 | Payer: 59 | Source: Ambulatory Visit

## 2017-05-01 ENCOUNTER — Encounter
Admission: RE | Admit: 2017-05-01 | Discharge: 2017-05-01 | Disposition: A | Discharge status: Home / Self Care | Payer: 59 | Source: Ambulatory Visit | Attending: General Surgery | Admitting: General Surgery

## 2017-05-01 NOTE — Patient Instructions (Signed)
  Your procedure is scheduled on: 05-04-17 Report to Same Day Surgery 2nd floor medical mall [facility] Entrance-take elevator on left to 2nd floor.  Check in with surgery information desk.) To find out your arrival time please call [phone] between 1PM - 3PM on 05-03-17  Remember: Instructions that are not followed completely may result in serious medical risk, up to and including death, or upon the discretion of your surgeon and anesthesiologist your surgery may need to be rescheduled.    _x___ 1. Do not eat food after midnight the night before your procedure. You may drink clear liquids up to 2 hours before you are scheduled to arrive at the hospital for your procedure.  Do not drink clear liquids within 2 hours of your scheduled arrival to the hospital.  Clear liquids include  --Water or Apple juice without pulp  --Clear carbohydrate beverage such as ClearFast or Gatorade  --Black Coffee or Clear Tea (No milk, no creamers, do not add anything to  the coffee or Tea Type 1 and type 2 diabetics should only drink water.  No gum chewing or hard candies.     __x__ 2. No Alcohol for 24 hours before or after surgery.   __x__3. No Smoking for 24 prior to surgery.   ____  4. Bring all medications with you on the day of surgery if instructed.    __x__ 5. Notify your doctor if there is any change in your medical condition     (cold, fever, infections).     Do not wear jewelry, make-up, hairpins, clips or nail polish.  Do not wear lotions, powders, or perfumes. You may wear deodorant.  Do not shave 48 hours prior to surgery. Men may shave face and neck.  Do not bring valuables to the hospital.    [facility] is not responsible for any belongings or valuables.               Contacts, dentures or bridgework may not be worn into surgery.  Leave your suitcase in the car. After surgery it may be brought to your room.  For patients admitted to the hospital, discharge time is determined by  your treatment team.   Patients discharged the day of surgery will not be allowed to drive home.  You will need someone to drive you home and stay with you the night of your procedure.    Please read over the following fact sheets that you were given:     ____ Take anti-hypertensive listed below, cardiac, seizure, asthma, anti-reflux and psychiatric medicines. These include:  1. NONE  2.  3.  4.  5.  6.  ____Fleets enema or Magnesium Citrate as directed.   _x___ Use CHG Soap or sage wipes as directed on instruction sheet   ____ Use inhalers on the day of surgery and bring to hospital day of surgery  ____ Stop Metformin and Janumet 2 days prior to surgery.    ____ Take 1/2 of usual insulin dose the night before surgery and none on the morning  surgery.   ____ Follow recommendations from Cardiologist, Pulmonologist about stopping Aspirin, Coumadin, Plavix ,Eliquis, Effient, or Pradaxa, and Pletal.  ____Stop Anti-inflammatories such as Advil, Aleve, Ibuprofen, Motrin, Naproxen, Naprosyn, Goodies powders or aspirin products.  OK to take Tylenol    ____ Stop supplements until after surgery.    ____ Bring C-Pap to the hospital.

## 2017-05-04 ENCOUNTER — Encounter
Admission: RE | Disposition: A | Discharge status: Home / Self Care | Payer: Self-pay | Source: Ambulatory Visit | Attending: General Surgery

## 2017-05-04 ENCOUNTER — Ambulatory Visit: Payer: 59 | Admitting: Anesthesiology

## 2017-05-04 ENCOUNTER — Ambulatory Visit
Admission: RE | Admit: 2017-05-04 | Discharge: 2017-05-04 | Disposition: A | Discharge status: Home / Self Care | Payer: 59 | Source: Ambulatory Visit | Attending: General Surgery | Admitting: General Surgery

## 2017-05-04 ENCOUNTER — Encounter: Payer: Self-pay | Admitting: *Deleted

## 2017-05-04 DIAGNOSIS — F17210 Nicotine dependence, cigarettes, uncomplicated: Secondary | ICD-10-CM | POA: Diagnosis not present

## 2017-05-04 DIAGNOSIS — K219 Gastro-esophageal reflux disease without esophagitis: Secondary | ICD-10-CM | POA: Diagnosis not present

## 2017-05-04 DIAGNOSIS — Z79899 Other long term (current) drug therapy: Secondary | ICD-10-CM | POA: Diagnosis not present

## 2017-05-04 DIAGNOSIS — D241 Benign neoplasm of right breast: Secondary | ICD-10-CM

## 2017-05-04 DIAGNOSIS — Z803 Family history of malignant neoplasm of breast: Secondary | ICD-10-CM | POA: Insufficient documentation

## 2017-05-04 DIAGNOSIS — Z8041 Family history of malignant neoplasm of ovary: Secondary | ICD-10-CM | POA: Insufficient documentation

## 2017-05-04 DIAGNOSIS — N6310 Unspecified lump in the right breast, unspecified quadrant: Secondary | ICD-10-CM | POA: Diagnosis present

## 2017-05-04 HISTORY — PX: BREAST LUMPECTOMY: SHX2

## 2017-05-04 LAB — POCT PREGNANCY, URINE: PREG TEST UR: NEGATIVE

## 2017-05-04 SURGERY — BREAST LUMPECTOMY
Anesthesia: General | Laterality: Right

## 2017-05-04 MED ORDER — MIDAZOLAM HCL 2 MG/2ML IJ SOLN
INTRAMUSCULAR | Status: DC | PRN
Start: 2017-05-04 — End: 2017-05-04
  Administered 2017-05-04: 2 mg via INTRAVENOUS

## 2017-05-04 MED ORDER — ONDANSETRON HCL 4 MG/2ML IJ SOLN
INTRAMUSCULAR | Status: DC | PRN
  Administered 2017-05-04: 4 mg via INTRAVENOUS

## 2017-05-04 MED ORDER — FAMOTIDINE 20 MG PO TABS
20.0000 mg | ORAL_TABLET | Freq: Once | ORAL | Status: AC
  Administered 2017-05-04: 20 mg via ORAL

## 2017-05-04 MED ORDER — BUPIVACAINE HCL (PF) 0.5 % IJ SOLN
INTRAMUSCULAR | Status: DC | PRN
  Administered 2017-05-04: 7 mL

## 2017-05-04 MED ORDER — LACTATED RINGERS IV SOLN
INTRAVENOUS | Status: DC
  Administered 2017-05-04 (×2): via INTRAVENOUS

## 2017-05-04 MED ORDER — FENTANYL CITRATE (PF) 100 MCG/2ML IJ SOLN
INTRAMUSCULAR | Status: DC | PRN
  Administered 2017-05-04: 25 ug via INTRAVENOUS
  Administered 2017-05-04: 50 ug via INTRAVENOUS
  Administered 2017-05-04: 25 ug via INTRAVENOUS

## 2017-05-04 MED ORDER — MIDAZOLAM HCL 2 MG/2ML IJ SOLN
INTRAMUSCULAR | Status: AC
  Filled 2017-05-04: qty 2

## 2017-05-04 MED ORDER — PROPOFOL 10 MG/ML IV BOLUS
INTRAVENOUS | Status: DC | PRN
  Administered 2017-05-04: 160 mg via INTRAVENOUS
  Administered 2017-05-04 (×2): 20 mg via INTRAVENOUS

## 2017-05-04 MED ORDER — PROMETHAZINE HCL 25 MG/ML IJ SOLN: 6.2500 mg | INTRAMUSCULAR | Status: DC | PRN

## 2017-05-04 MED ORDER — FENTANYL CITRATE (PF) 100 MCG/2ML IJ SOLN: 25.0000 ug | INTRAMUSCULAR | Status: DC | PRN

## 2017-05-04 MED ORDER — LIDOCAINE HCL (CARDIAC) 20 MG/ML IV SOLN
INTRAVENOUS | Status: DC | PRN
Start: 2017-05-04 — End: 2017-05-04
  Administered 2017-05-04: 40 mg via INTRAVENOUS

## 2017-05-04 MED ORDER — FAMOTIDINE 20 MG PO TABS
ORAL_TABLET | ORAL | Status: AC
  Filled 2017-05-04: qty 1

## 2017-05-04 MED ORDER — EPHEDRINE SULFATE 50 MG/ML IJ SOLN
INTRAMUSCULAR | Status: AC
  Filled 2017-05-04: qty 1

## 2017-05-04 MED ORDER — FENTANYL CITRATE (PF) 100 MCG/2ML IJ SOLN
INTRAMUSCULAR | Status: AC
  Filled 2017-05-04: qty 2

## 2017-05-04 MED ORDER — LIDOCAINE HCL (PF) 2 % IJ SOLN
INTRAMUSCULAR | Status: AC
  Filled 2017-05-04: qty 10

## 2017-05-04 MED ORDER — DEXAMETHASONE SODIUM PHOSPHATE 10 MG/ML IJ SOLN
INTRAMUSCULAR | Status: DC | PRN
  Administered 2017-05-04: 8 mg via INTRAVENOUS

## 2017-05-04 MED ORDER — CHLORHEXIDINE GLUCONATE CLOTH 2 % EX PADS: 6.0000 | MEDICATED_PAD | Freq: Once | CUTANEOUS | Status: DC

## 2017-05-04 MED ORDER — EPHEDRINE SULFATE 50 MG/ML IJ SOLN
INTRAMUSCULAR | Status: DC | PRN
Start: 2017-05-04 — End: 2017-05-04
  Administered 2017-05-04: 10 mg via INTRAVENOUS

## 2017-05-04 MED ORDER — PROPOFOL 10 MG/ML IV BOLUS
INTRAVENOUS | Status: AC
  Filled 2017-05-04: qty 20

## 2017-05-04 MED ORDER — BUPIVACAINE HCL (PF) 0.5 % IJ SOLN
INTRAMUSCULAR | Status: AC
  Filled 2017-05-04: qty 30

## 2017-05-04 MED ORDER — DEXAMETHASONE SODIUM PHOSPHATE 10 MG/ML IJ SOLN
INTRAMUSCULAR | Status: AC
  Filled 2017-05-04: qty 1

## 2017-05-04 MED ORDER — TRAMADOL HCL 50 MG PO TABS: 50.0000 mg | ORAL_TABLET | Freq: Four times a day (QID) | ORAL | 0 refills | Status: DC | PRN

## 2017-05-04 MED ORDER — SODIUM CHLORIDE 0.9 % IJ SOLN
INTRAMUSCULAR | Status: AC
  Filled 2017-05-04: qty 10

## 2017-05-04 SURGICAL SUPPLY — 31 items
BLADE SURG 15 STRL SS SAFETY (BLADE) ×6 IMPLANT
BULB RESERV EVAC DRAIN JP 100C (MISCELLANEOUS) IMPLANT
CANISTER SUCT 1200ML W/VALVE (MISCELLANEOUS) ×3 IMPLANT
CHLORAPREP W/TINT 26ML (MISCELLANEOUS) ×3 IMPLANT
CNTNR SPEC 2.5X3XGRAD LEK (MISCELLANEOUS) ×1
CONT SPEC 4OZ STER OR WHT (MISCELLANEOUS) ×2
CONTAINER SPEC 2.5X3XGRAD LEK (MISCELLANEOUS) ×1 IMPLANT
COVER PROBE FLX POLY STRL (MISCELLANEOUS) ×3 IMPLANT
DERMABOND ADVANCED (GAUZE/BANDAGES/DRESSINGS) ×2
DERMABOND ADVANCED .7 DNX12 (GAUZE/BANDAGES/DRESSINGS) ×1 IMPLANT
DEVICE LOCALIZATION ULTRAWIRE (WIRE) ×1 IMPLANT
DRAIN CHANNEL JP 15F RND 16 (MISCELLANEOUS) IMPLANT
DRAPE LAPAROTOMY TRNSV 106X77 (MISCELLANEOUS) ×3 IMPLANT
ELECT REM PT RETURN 9FT ADLT (ELECTROSURGICAL) ×3
ELECTRODE REM PT RTRN 9FT ADLT (ELECTROSURGICAL) ×1 IMPLANT
GLOVE BIO SURGEON STRL SZ7 (GLOVE) ×3 IMPLANT
GOWN STRL REUS W/ TWL LRG LVL3 (GOWN DISPOSABLE) ×3 IMPLANT
GOWN STRL REUS W/TWL LRG LVL3 (GOWN DISPOSABLE) ×6
KIT RM TURNOVER STRD PROC AR (KITS) ×3 IMPLANT
LABEL OR SOLS (LABEL) ×3 IMPLANT
MARGIN MAP 10MM (MISCELLANEOUS) ×3 IMPLANT
NEEDLE HYPO 25X1 1.5 SAFETY (NEEDLE) ×3 IMPLANT
PACK BASIN MINOR ARMC (MISCELLANEOUS) ×3 IMPLANT
SHEARS HARMONIC 9CM CVD (BLADE) IMPLANT
SUT ETH BLK MONO 3 0 FS 1 12/B (SUTURE) ×3 IMPLANT
SUT MNCRL AB 3-0 PS2 27 (SUTURE) ×3 IMPLANT
SUT VIC AB 2-0 BRD 54 (SUTURE) ×3 IMPLANT
SUT VIC AB 2-0 CT2 27 (SUTURE) ×3 IMPLANT
SYR CONTROL 10ML (SYRINGE) ×3 IMPLANT
ULTRAWIRE LOCALIZATION DEVICE (WIRE) ×3
WATER STERILE IRR 1000ML POUR (IV SOLUTION) ×3 IMPLANT

## 2017-05-04 NOTE — Discharge Instructions (Signed)
AMBULATORY SURGERY  °DISCHARGE INSTRUCTIONS ° ° °1) The drugs that you were given will stay in your system until tomorrow so for the next 24 hours you should not: ° °A) Drive an automobile °B) Make any legal decisions °C) Drink any alcoholic beverage ° ° °2) You may resume regular meals tomorrow.  Today it is better to start with liquids and gradually work up to solid foods. ° °You may eat anything you prefer, but it is better to start with liquids, then soup and crackers, and gradually work up to solid foods. ° ° °3) Please notify your doctor immediately if you have any unusual bleeding, trouble breathing, redness and pain at the surgery site, drainage, fever, or pain not relieved by medication. ° ° ° °4) Additional Instructions: ° ° ° ° ° ° ° °Please contact your physician with any problems or Same Day Surgery at [phone], Monday through Friday 6 am to 4 pm, or [place] [facility] [place] Main number at [phone]. °

## 2017-05-04 NOTE — Transfer of Care (Signed)
Immediate Anesthesia Transfer of Care Note  Patient: Lynn Nguyen  Procedure(s) Performed: EXCISION BREAST MASS (Right )  Patient Location: PACU  Anesthesia Type:General  Level of Consciousness: awake  Airway & Oxygen Therapy: Patient Spontanous Breathing and Patient connected to face mask oxygen  Post-op Assessment: Report given to RN and Post -op Vital signs reviewed and stable  Post vital signs: Reviewed and stable  Last Vitals:  Vitals:   05/04/17 0616  BP: 119/63  Pulse: 85  Resp: 18  Temp: 36.6 C  SpO2: 98%    Last Pain:  Vitals:   05/04/17 0616  TempSrc: Tympanic         Complications: No apparent anesthesia complications

## 2017-05-04 NOTE — Anesthesia Procedure Notes (Signed)
Procedure Name: LMA Insertion Date/Time: 05/04/2017 7:35 AM Performed by: Allean Found Pre-anesthesia Checklist: Patient identified, Emergency Drugs available, Suction available, Patient being monitored and Timeout performed Patient Re-evaluated:Patient Re-evaluated prior to induction Oxygen Delivery Method: Circle system utilized Preoxygenation: Pre-oxygenation with 100% oxygen Induction Type: IV induction Ventilation: Mask ventilation without difficulty LMA: LMA inserted LMA Size: 4.0 Number of attempts: 1 Placement Confirmation: positive ETCO2 and breath sounds checked- equal and bilateral Tube secured with: Tape Dental Injury: Teeth and Oropharynx as per pre-operative assessment

## 2017-05-04 NOTE — Op Note (Signed)
Preop diagnosis: Fibroadenoma right breast  Post op diagnosis: Same  Operation: Excision right breast fibroadenoma with ultrasound guidance and wire localization  Surgeon: Mckinley Jewel  Assistant:     Anesthesia: Gen.  Complications: None  EBL: Minimal  Drains: None  Description: Patient was put to sleep with an LMA and the right breast prepped and draped as sterile field. Timeout was performed. Ultrasound probe was brought up to the field a sterile cover. The fibroadenoma was located at the 1:00 location approximately 4 cm from the nipple. This was identified of the ultrasound and a Bard Ultra wire was positioned into with through a small stab incision on the areolar margin, following this a circumareolar incision was made extending from the wire on both sides in the upper inner quadrant. The skin and subcutaneous tissue were elevated on both sides. Using the wire as a guide the glandular tissue was exposed and the fibroadenoma  identified. With cautery the mass was excised out with some some normal tissue surrounding it. This was sent to pathology fresh. After ensuring hemostasis with cautery the gland elements were approximated with 2-0 Vicryl. Subcutaneous tissue also closed with 2-0 Vicryl. Skin was closed with subcuticular 3-0 Monocryl. Dermabond was applied. Patient subsequently returned recovery room stable condition

## 2017-05-04 NOTE — Anesthesia Post-op Follow-up Note (Signed)
Anesthesia QCDR form completed.        

## 2017-05-04 NOTE — Progress Notes (Signed)
Applied ice pack to right breast

## 2017-05-04 NOTE — H&P (Signed)
Lynn Nguyen is an 36 y.o. female.   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 On serial follow up it has ben slowly enlarging. Excision recommended and pt was agreeable. Has FH of breast cancer  Past Medical History:  Diagnosis Date  . BRCA negative 2014  . GERD (gastroesophageal reflux disease)   . Vaginal Pap smear, abnormal     Past Surgical History:  Procedure Laterality Date  . BREAST BIOPSY Right 12/25/13   neg    Family History  Problem Relation Age of Onset  . Breast cancer Maternal Grandmother 60       breast  . Ovarian cancer Paternal Grandmother        ovarian  . Breast cancer Maternal Aunt 79      breast  . Breast cancer Sister 48      breast   Social History:  reports that she has been smoking Cigarettes.  She has a 10.00 pack-year smoking history. She has never used smokeless tobacco. She reports that she drinks alcohol. She reports that she does not use drugs.  Allergies: No Known Allergies  Medications Prior to Admission  Medication Sig Dispense Refill  . MedroxyPROGESTERone Acetate 150 MG/ML SUSY Inject 150 mg into the muscle every 3 (three) months.       Results for orders placed or performed during the hospital encounter of 05/04/17 (from the past 48 hour(s))  Pregnancy, urine POC     Status: None   Collection Time: 05/04/17  6:28 AM  Result Value Ref Range   Preg Test, Ur NEGATIVE NEGATIVE    Comment:        THE SENSITIVITY OF THIS METHODOLOGY IS >24 mIU/mL    No results found.  Review of Systems  Constitutional: Negative.   HENT: Negative.   Respiratory: Negative.   Cardiovascular: Negative.   Gastrointestinal: Negative.     Blood pressure 119/63, pulse 85, temperature 97.8 F (36.6 C), temperature source Tympanic, resp. rate 18, height _0  (1.727 m), weight 163 lb (73.9 kg), SpO2 98 %, unknown if currently breastfeeding. Physical Exam  Constitutional: She is oriented to  person, place, and time. She appears well-developed and well-nourished.  Eyes: Conjunctivae are normal. No scleral icterus.  Neck: Neck supple.  Cardiovascular: Normal rate and regular rhythm.   Respiratory: Effort normal and breath sounds normal. Right breast exhibits no inverted nipple, no mass, no nipple discharge, no skin change and no tenderness. Left breast exhibits no inverted nipple, no mass, no nipple discharge, no skin change and no tenderness. Breasts are symmetrical.  Lymphadenopathy:    She has no cervical adenopathy.    She has no axillary adenopathy.  Neurological: She is alert and oriented to person, place, and time.  Skin: Skin is warm and dry.     Assessment/Plan OK to proceed with planned excision of right breast fibroadenoma  SChristene Lye MD 05/04/2017, 7:18 AM

## 2017-05-04 NOTE — Anesthesia Postprocedure Evaluation (Signed)
Anesthesia Post Note  Patient: Lynn Nguyen  Procedure(s) Performed: EXCISION BREAST MASS (Right )  Patient location during evaluation: PACU Anesthesia Type: General Level of consciousness: awake and alert Pain management: pain level controlled Vital Signs Assessment: post-procedure vital signs reviewed and stable Respiratory status: spontaneous breathing, nonlabored ventilation, respiratory function stable and patient connected to nasal cannula oxygen Cardiovascular status: blood pressure returned to baseline and stable Postop Assessment: no apparent nausea or vomiting Anesthetic complications: no     Last Vitals:  Vitals:   05/04/17 0919 05/04/17 0943  BP: 103/73 101/68  Pulse: 71 76  Resp: 14 14  Temp: 37.1 C   SpO2: 98% 99%    Last Pain:  Vitals:   05/04/17 0919  TempSrc: Temporal  PainSc:                  Martha Clan

## 2017-05-04 NOTE — Anesthesia Preprocedure Evaluation (Signed)
Anesthesia Evaluation  Patient identified by MRN, date of birth, ID band Patient awake    Reviewed: Allergy & Precautions, H&P , NPO status , Patient's Chart, lab work & pertinent test results  History of Anesthesia Complications Negative for: history of anesthetic complications  Airway Mallampati: I  TM Distance: >3 FB     Dental no notable dental hx. (+) Dental Advidsory Given   Pulmonary neg shortness of breath, neg COPD, neg recent URI, Current Smoker,           Cardiovascular negative cardio ROS       Neuro/Psych negative neurological ROS  negative psych ROS   GI/Hepatic negative GI ROS, Neg liver ROS,   Endo/Other  negative endocrine ROS  Renal/GU negative Renal ROS  negative genitourinary   Musculoskeletal   Abdominal   Peds  Hematology negative hematology ROS (+)   Anesthesia Other Findings Past Medical History: 2014: BRCA negative No date: GERD (gastroesophageal reflux disease) No date: Vaginal Pap smear, abnormal   Reproductive/Obstetrics negative OB ROS                             Anesthesia Physical  Anesthesia Plan  ASA: II  Anesthesia Plan: General   Post-op Pain Management:    Induction: Intravenous  PONV Risk Score and Plan: 2 and Ondansetron and Dexamethasone  Airway Management Planned: LMA  Additional Equipment:   Intra-op Plan:   Post-operative Plan: Extubation in OR  Informed Consent:   Plan Discussed with: Anesthesiologist  Anesthesia Plan Comments:         Anesthesia Quick Evaluation

## 2017-05-05 LAB — SURGICAL PATHOLOGY

## 2017-05-08 ENCOUNTER — Telehealth: Payer: Self-pay

## 2017-05-08 NOTE — Telephone Encounter (Signed)
-----   Message from Christene Lye, MD sent at 05/08/2017  9:31 AM EDT ----- Please inform pt path showed benign fibroadenoma

## 2017-05-08 NOTE — Telephone Encounter (Signed)
Notified patient as instructed, patient pleased. Discussed follow-up appointments, patient agrees  

## 2017-05-09 ENCOUNTER — Ambulatory Visit (INDEPENDENT_AMBULATORY_CARE_PROVIDER_SITE_OTHER): Payer: 59 | Admitting: General Surgery

## 2017-05-09 ENCOUNTER — Encounter: Payer: Self-pay | Admitting: General Surgery

## 2017-05-09 VITALS — BP 108/66 | HR 84 | Resp 12 | Ht 69.0 in | Wt 168.0 lb

## 2017-05-09 DIAGNOSIS — D241 Benign neoplasm of right breast: Secondary | ICD-10-CM

## 2017-05-09 NOTE — Patient Instructions (Addendum)
The patient is aware to call back for any questions or concerns. Follow up in 6 weeks Recommend mammogram every other year until age 35 then mammograms every year thereafter

## 2017-05-09 NOTE — Progress Notes (Signed)
Patient ID: Lynn Nguyen, female   DOB: 1981-03-22, 36 y.o.   MRN: [id number]  Chief Complaint  Patient presents with  . Routine Post Op    HPI Lynn Nguyen is a 36 y.o. female.  Here for postoperative visit, right breast fibroadenoma excision on 05-04-17. Family hx of breast cancer. BRCA negative. She states she is doing well with some mild pain and bruising in her right breast.  HPI  Past Medical History:  Diagnosis Date  . BRCA negative 2014  . GERD (gastroesophageal reflux disease)   . Vaginal Pap smear, abnormal     Past Surgical History:  Procedure Laterality Date  . BREAST BIOPSY Right 12/25/13   neg  . BREAST LUMPECTOMY Right 05/04/2017    FIBROADENOMA EXCISION BREAST MASS;  Surgeon: Christene Lye, MD;  Location: [facility] ORS;  Service: General;  Laterality: Right;    Family History  Problem Relation Age of Onset  . Breast cancer Maternal Grandmother 60       breast  . Ovarian cancer Paternal Grandmother        ovarian  . Breast cancer Maternal Aunt 16       breast  . Breast cancer Sister 81       breast    Social History Social History  Substance Use Topics  . Smoking status: Current Some Day Smoker    Packs/day: 1.00    Years: 10.00    Types: Cigarettes  . Smokeless tobacco: Never Used  . Alcohol use Yes     Comment: WINE OCC    No Known Allergies  Current Outpatient Prescriptions  Medication Sig Dispense Refill  . MedroxyPROGESTERone Acetate 150 MG/ML SUSY Inject 150 mg into the muscle every 3 (three) months.      No current facility-administered medications for this visit.    Facility-Administered Medications Ordered in Other Visits  Medication Dose Route Frequency Provider Last Rate Last Dose  . bupivacaine (PF) (MARCAINE) 0.25 % injection    Anesthesia Intra-op Lynn Salvage, CRNA   4 mL at 11/21/14 1218  . lidocaine-EPINEPHrine 1.5 %-1:200000 injection    Anesthesia Intra-op Lynn Salvage, CRNA   3 mL at 11/21/14 1210     Review of Systems Review of Systems  Constitutional: Negative.   Respiratory: Negative.   Cardiovascular: Negative.     Blood pressure 108/66, pulse 84, resp. rate 12, height 5' 9" (1.753 m), weight 168 lb (76.2 kg), unknown if currently breastfeeding.  Physical Exam Physical Exam  Constitutional: She is oriented to person, place, and time. She appears well-developed and well-nourished.  Pulmonary/Chest: Tenderness: mild tenderness and bruising in right breast.    Neurological: She is alert and oriented to person, place, and time.  Skin: Skin is warm and dry.  Psychiatric: Her behavior is normal.    Data Reviewed  Op note, surgical pathology Surgical pathology revealed a 1.3 cm fibroadenoma  Assessment    Pt with significant family history of breast cancer. Is BRCA negative. Post op for excision of right breast fibroadenoma with ultrasound guidance and wire localization. Pathology revealed fibroadenoma. Incision is healing well. Good postoperative course.     Plan    Follow up in 6 weeks  Recommend mammogram every other year until age 75 then mammograms every year there after.     HPI, Physical Exam, Assessment and Plan have been scribed under the direction and in the presence of Lynn Jewel, MD Lynn Fetch, RN I have completed the exam and reviewed the  above documentation for accuracy and completeness.  I agree with the above.  [employer] has been used and any errors in dictation or transcription are unintentional.  Lynn Nguyen, M.D., F.A.C.S.   Nguyen,Lynn G 05/10/2017, 11:32 AM    

## 2017-05-11 ENCOUNTER — Ambulatory Visit: Payer: 59 | Admitting: General Surgery

## 2017-05-23 ENCOUNTER — Telehealth: Payer: Self-pay | Admitting: Obstetrics and Gynecology

## 2017-05-23 ENCOUNTER — Other Ambulatory Visit: Payer: Self-pay

## 2017-05-23 DIAGNOSIS — Z3042 Encounter for surveillance of injectable contraceptive: Secondary | ICD-10-CM

## 2017-05-23 MED ORDER — MEDROXYPROGESTERONE ACETATE 150 MG/ML IM SUSY: 150.0000 mg | PREFILLED_SYRINGE | INTRAMUSCULAR | 0 refills | Status: DC

## 2017-05-23 NOTE — Telephone Encounter (Signed)
Pt is calling about her Depo injection. Pt is schedule with [facility] 05/24/17 and needs refill before appt. Please advise

## 2017-05-24 ENCOUNTER — Ambulatory Visit: Payer: 59

## 2017-05-24 ENCOUNTER — Encounter: Payer: Self-pay | Admitting: Obstetrics and Gynecology

## 2017-05-24 ENCOUNTER — Ambulatory Visit (INDEPENDENT_AMBULATORY_CARE_PROVIDER_SITE_OTHER): Payer: 59 | Admitting: Obstetrics and Gynecology

## 2017-05-24 VITALS — BP 122/74 | Ht 68.0 in | Wt 170.0 lb

## 2017-05-24 DIAGNOSIS — Z1231 Encounter for screening mammogram for malignant neoplasm of breast: Secondary | ICD-10-CM

## 2017-05-24 DIAGNOSIS — Z9189 Other specified personal risk factors, not elsewhere classified: Secondary | ICD-10-CM

## 2017-05-24 DIAGNOSIS — Z1239 Encounter for other screening for malignant neoplasm of breast: Secondary | ICD-10-CM

## 2017-05-24 DIAGNOSIS — Z01419 Encounter for gynecological examination (general) (routine) without abnormal findings: Secondary | ICD-10-CM

## 2017-05-24 DIAGNOSIS — Z3042 Encounter for surveillance of injectable contraceptive: Secondary | ICD-10-CM

## 2017-05-24 MED ORDER — MEDROXYPROGESTERONE ACETATE 150 MG/ML IM SUSY: 150.0000 mg | PREFILLED_SYRINGE | INTRAMUSCULAR | 3 refills | Status: DC

## 2017-05-24 MED ORDER — MEDROXYPROGESTERONE ACETATE 150 MG/ML IM SUSP
150.0000 mg | Freq: Once | INTRAMUSCULAR | Status: AC
  Administered 2017-05-24: 150 mg via INTRAMUSCULAR

## 2017-05-24 NOTE — Patient Instructions (Signed)
I value your feedback and entrusting us with your care. If you get a [facility] patient survey, I would appreciate you taking the time to let us know about your experience today. Thank you! 

## 2017-05-24 NOTE — Progress Notes (Signed)
PCP:  Chad Cordial, PA-C   Chief Complaint  Patient presents with  . Annual Exam     HPI:      [name] is a 36 y.o. G2P1001 who LMP was No LMP recorded. Patient has had an injection., presents today for her annual examination.  Her menses are absent with depo. Dysmenorrhea none. She does have occas intermenstrual bleeding.  Sex activity: single partner, contraception - Depo-Provera injections. Wants to continue. Last Pap: April 28, 2016  Results were: no abnormalities /neg HPV DNA  Hx of STDs: none  Last mammogram: February 17, 2017  Results were: normal--routine follow-up in 12 months. 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 Jamal Collin. There is a FH of breast cancer in her sister, mat aunt and MGGM. Pt is MyRisk neg 2015. There is a FH of ovarian cancer in her PGM. The patient does do self-breast exams. 2017 IBIS=27%. She is not taking Vit D supp and was slightly deficient last yr.   Tobacco use: The patient currently smokes 1/2 packs of cigarettes per day for the past many years. Alcohol use: none No drug use.  Exercise: moderately active  She does get adequate calcium in her diet.  Normal labs 10/17.   Past Medical History:  Diagnosis Date  . Abnormal Pap smear of cervix   . BRCA negative 2015   MyRisk  . Family history of breast cancer   . 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 GERD (gastroesophageal reflux disease)   . Herpes simplex   . Hyperhidrosis   . Increased risk of breast cancer 2017   IBIS=27%  . Vaginal Pap smear, abnormal     Past Surgical History:  Procedure Laterality Date  . BREAST BIOPSY Right 12/25/13   neg    Family History  Problem Relation Age of Onset  . Breast cancer Maternal Grandmother 60       breast  . Ovarian cancer Paternal Grandmother        ovarian  . Breast cancer Maternal Aunt 57       breast  . Breast cancer Sister 61       breast    Social History   Socioeconomic History  . Marital  status: Married    Spouse name: Not on file  . Number of children: Not on file  . Years of education: Not on file  . Highest education level: Not on file  Social Needs  . Financial resource strain: Not on file  . Food insecurity - worry: Not on file  . Food insecurity - inability: Not on file  . Transportation needs - medical: Not on file  . Transportation needs - non-medical: Not on file  Occupational History  . Not on file  Tobacco Use  . Smoking status: Current Some Day Smoker    Packs/day: 1.00    Years: 10.00    Pack years: 10.00    Types: Cigarettes  . Smokeless tobacco: Never Used  Substance and Sexual Activity  . Alcohol use: Yes    Comment: WINE OCC  . Drug use: No  . Sexual activity: Yes    Birth control/protection: Pill  Other Topics Concern  . Not on file  Social History Narrative  . Not on file    No outpatient medications have been marked as taking for the 05/24/17 encounter (Office Visit) with Dollene Mallery, Deirdre Evener, PA-C.     ROS:  Review of Systems  Constitutional: Negative for  fatigue, fever and unexpected weight change.  Respiratory: Negative for cough, shortness of breath and wheezing.   Cardiovascular: Negative for chest pain, palpitations and leg swelling.  Gastrointestinal: Negative for blood in stool, constipation, diarrhea, nausea and vomiting.  Endocrine: Negative for cold intolerance, heat intolerance and polyuria.  Genitourinary: Negative for dyspareunia, dysuria, flank pain, frequency, genital sores, hematuria, menstrual problem, pelvic pain, urgency, vaginal bleeding, vaginal discharge and vaginal pain.  Musculoskeletal: Negative for back pain, joint swelling and myalgias.  Skin: Negative for rash.  Neurological: Negative for dizziness, syncope, light-headedness, numbness and headaches.  Hematological: Negative for adenopathy.  Psychiatric/Behavioral: Negative for agitation, confusion, sleep disturbance and suicidal ideas. The patient is not  nervous/anxious.      Objective: BP 122/74   Ht 5' 8"  (1.727 m)   Wt 170 lb (77.1 kg)   BMI 25.85 kg/m    Physical Exam  Constitutional: She is oriented to person, place, and time. She appears well-developed and well-nourished.  Genitourinary: Vagina normal and uterus normal. There is no rash or tenderness on the right labia. There is no rash or tenderness on the left labia. No erythema or tenderness in the vagina. No vaginal discharge found. Right adnexum does not display mass and does not display tenderness. Left adnexum does not display mass and does not display tenderness. Cervix does not exhibit motion tenderness or polyp. Uterus is not enlarged or tender.  Neck: Normal range of motion. No thyromegaly present.  Cardiovascular: Normal rate, regular rhythm and normal heart sounds.  No murmur heard. Pulmonary/Chest: Effort normal and breath sounds normal. Right breast exhibits no mass, no nipple discharge, no skin change and no tenderness. Left breast exhibits no mass, no nipple discharge, no skin change and no tenderness.  Abdominal: Soft. There is no tenderness. There is no guarding.  Musculoskeletal: Normal range of motion.  Neurological: She is alert and oriented to person, place, and time. No cranial nerve deficit.  Psychiatric: She has a normal mood and affect. Her behavior is normal.  Vitals reviewed.   Assessment/Plan: Encounter for annual routine gynecological examination  Screening for breast cancer - Pt current on mammo. F/u with Dr. Jamal Collin 06/20/17 for RT breast fibroadenoma exc.  Increased risk of breast cancer - Cont monthly SBE, Q6-12 months CBE, yearly mammo and scr breast MRI. Pt to call for MRI 1/19 if desires. Add Vit D3 5000 IU daily  Encounter for surveillance of injectable contraceptive - Depo Rx RF. Cont calcium.  - Plan: MedroxyPROGESTERone Acetate 150 MG/ML SUSY  Meds ordered this encounter  Medications  . medroxyPROGESTERone (DEPO-PROVERA) injection  150 mg  . MedroxyPROGESTERone Acetate 150 MG/ML SUSY    Sig: Inject 1 mL (150 mg total) every 3 (three) months into the muscle.    Dispense:  1 Syringe    Refill:  3             GYN counsel breast self exam, mammography screening, adequate intake of calcium and vitamin D, diet and exercise     F/U  Return in about 1 year (around 05/24/2018). Pt can RTO in 6 months for CBE if not done with gen surg f/u next yr.  Peony Barner B. Hanish Laraia, PA-C 05/24/2017 4:49 PM

## 2017-06-20 ENCOUNTER — Ambulatory Visit: Payer: 59 | Admitting: General Surgery

## 2017-06-22 ENCOUNTER — Ambulatory Visit (INDEPENDENT_AMBULATORY_CARE_PROVIDER_SITE_OTHER): Payer: 59 | Admitting: General Surgery

## 2017-06-22 ENCOUNTER — Encounter: Payer: Self-pay | Admitting: General Surgery

## 2017-06-22 VITALS — BP 118/70 | HR 72 | Resp 12 | Ht 64.0 in | Wt 173.0 lb

## 2017-06-22 DIAGNOSIS — Z803 Family history of malignant neoplasm of breast: Secondary | ICD-10-CM

## 2017-06-22 DIAGNOSIS — D241 Benign neoplasm of right breast: Secondary | ICD-10-CM

## 2017-06-22 NOTE — Progress Notes (Signed)
Patient ID: Lynn Nguyen, female   DOB: 1980-10-21, 36 y.o.   MRN: [id number]  Chief Complaint  Patient presents with  . Follow-up    HPI Lynn Nguyen is a 36 y.o. female here today for her follow up right breast mass excision done on 05/04/2017. Patient states she is doing well.  HPI  Past Medical History:  Diagnosis Date  . Abnormal Pap smear of cervix   . BRCA negative 2015   MyRisk  . Family history of breast cancer   . 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 GERD (gastroesophageal reflux disease)   . Herpes simplex   . Hyperhidrosis   . Increased risk of breast cancer 2017   IBIS=27%  . Vaginal Pap smear, abnormal     Past Surgical History:  Procedure Laterality Date  . BREAST BIOPSY Right 12/25/13   neg  . BREAST LUMPECTOMY Right 05/04/2017    FIBROADENOMA EXCISION BREAST MASS;  Surgeon: Christene Lye, MD;  Location: [facility] ORS;  Service: General;  Laterality: Right;    Family History  Problem Relation Age of Onset  . Breast cancer Maternal Grandmother 60       breast  . Ovarian cancer Paternal Grandmother        ovarian  . Breast cancer Maternal Aunt 25       breast  . Breast cancer Sister 45       breast    Social History Social History   Tobacco Use  . Smoking status: Current Some Day Smoker    Packs/day: 1.00    Years: 10.00    Pack years: 10.00    Types: Cigarettes  . Smokeless tobacco: Never Used  Substance Use Topics  . Alcohol use: Yes    Comment: WINE OCC  . Drug use: No    No Known Allergies  Current Outpatient Medications  Medication Sig Dispense Refill  . MedroxyPROGESTERone Acetate 150 MG/ML SUSY Inject 1 mL (150 mg total) every 3 (three) months into the muscle. 1 Syringe 3   No current facility-administered medications for this visit.    Facility-Administered Medications Ordered in Other Visits  Medication Dose Route Frequency Provider Last Rate Last Dose  . bupivacaine (PF) (MARCAINE) 0.25 % injection    Anesthesia  Intra-op Doreen Salvage, CRNA   4 mL at 11/21/14 1218  . lidocaine-EPINEPHrine 1.5 %-1:200000 injection    Anesthesia Intra-op Doreen Salvage, CRNA   3 mL at 11/21/14 1210    Review of Systems Review of Systems  Blood pressure 118/70, pulse 72, resp. rate 12, height 5' 4"  (1.626 m), weight 173 lb (78.5 kg), unknown if currently breastfeeding.  Physical Exam Physical Exam  Constitutional: She is oriented to person, place, and time. She appears well-developed and well-nourished.  Pulmonary/Chest: Right breast exhibits no inverted nipple, no mass, no nipple discharge, no skin change and no tenderness. Left breast exhibits no inverted nipple, no mass, no nipple discharge, no skin change and no tenderness.  Right breast incision is well healed.   Neurological: She is alert and oriented to person, place, and time.  Skin: Skin is warm and dry.    Data Reviewed Prior notes and op note reviewed   Assessment    Right breast fibroadenoma , Family history of breast cancer, stable exam     Plan       Patient to return as needed. The patient is aware to call back for any questions or concerns. Patient to return to her PCP for  mammogram and breast checks.   HPI, Physical Exam, Assessment and Plan have been scribed under the direction and in the presence of Mckinley Jewel, MD  Gaspar Cola, CMA  I have completed the exam and reviewed the above documentation for accuracy and completeness.  I agree with the above.  Haematologist has been used and any errors in dictation or transcription are unintentional.  Mildred Tuccillo G. Jamal Collin, M.D., F.A.C.S.    Junie Panning G 06/26/2017, 12:36 PM

## 2017-06-22 NOTE — Patient Instructions (Addendum)
Patient to return as needed. The patient is aware to call back for any questions or concerns. Patient to return to her PCP for mammogram and breast checks.

## 2017-08-17 ENCOUNTER — Other Ambulatory Visit: Payer: Self-pay | Admitting: Obstetrics and Gynecology

## 2017-08-17 ENCOUNTER — Other Ambulatory Visit: Payer: Self-pay

## 2017-08-17 ENCOUNTER — Telehealth: Payer: Self-pay

## 2017-08-17 DIAGNOSIS — Z3042 Encounter for surveillance of injectable contraceptive: Secondary | ICD-10-CM

## 2017-08-17 MED ORDER — MEDROXYPROGESTERONE ACETATE 150 MG/ML IM SUSY: 150.0000 mg | PREFILLED_SYRINGE | INTRAMUSCULAR | 2 refills | Status: DC

## 2017-08-17 MED ORDER — MEDROXYPROGESTERONE ACETATE 150 MG/ML IM SUSY: 150.0000 mg | PREFILLED_SYRINGE | INTRAMUSCULAR | 3 refills | Status: DC

## 2017-08-17 NOTE — Telephone Encounter (Signed)
PT calling stating she has an appointment for Depo provera and needs a refill. CB# [phone]

## 2017-08-17 NOTE — Telephone Encounter (Signed)
Pt aware.

## 2017-08-18 ENCOUNTER — Ambulatory Visit (INDEPENDENT_AMBULATORY_CARE_PROVIDER_SITE_OTHER): Payer: Managed Care, Other (non HMO)

## 2017-08-18 DIAGNOSIS — Z3042 Encounter for surveillance of injectable contraceptive: Secondary | ICD-10-CM

## 2017-08-18 MED ORDER — MEDROXYPROGESTERONE ACETATE 150 MG/ML IM SUSP
150.0000 mg | Freq: Once | INTRAMUSCULAR | Status: AC
  Administered 2017-08-18: 150 mg via INTRAMUSCULAR

## 2017-11-10 ENCOUNTER — Ambulatory Visit (INDEPENDENT_AMBULATORY_CARE_PROVIDER_SITE_OTHER): Payer: Managed Care, Other (non HMO)

## 2017-11-10 DIAGNOSIS — Z3042 Encounter for surveillance of injectable contraceptive: Secondary | ICD-10-CM | POA: Diagnosis not present

## 2017-11-10 MED ORDER — MEDROXYPROGESTERONE ACETATE 150 MG/ML IM SUSP
150.0000 mg | Freq: Once | INTRAMUSCULAR | Status: AC
  Administered 2017-11-10: 150 mg via INTRAMUSCULAR

## 2018-01-29 ENCOUNTER — Ambulatory Visit (INDEPENDENT_AMBULATORY_CARE_PROVIDER_SITE_OTHER): Payer: Managed Care, Other (non HMO)

## 2018-01-29 ENCOUNTER — Ambulatory Visit: Payer: Managed Care, Other (non HMO)

## 2018-01-29 DIAGNOSIS — Z3042 Encounter for surveillance of injectable contraceptive: Secondary | ICD-10-CM | POA: Diagnosis not present

## 2018-01-29 MED ORDER — MEDROXYPROGESTERONE ACETATE 150 MG/ML IM SUSP
150.0000 mg | Freq: Once | INTRAMUSCULAR | Status: AC
  Administered 2018-01-29: 150 mg via INTRAMUSCULAR

## 2018-04-23 ENCOUNTER — Ambulatory Visit: Payer: Managed Care, Other (non HMO)

## 2018-04-26 ENCOUNTER — Ambulatory Visit (INDEPENDENT_AMBULATORY_CARE_PROVIDER_SITE_OTHER): Payer: Managed Care, Other (non HMO)

## 2018-04-26 DIAGNOSIS — Z3042 Encounter for surveillance of injectable contraceptive: Secondary | ICD-10-CM | POA: Diagnosis not present

## 2018-04-26 MED ORDER — MEDROXYPROGESTERONE ACETATE 150 MG/ML IM SUSP
150.0000 mg | Freq: Once | INTRAMUSCULAR | Status: AC
  Administered 2018-04-26: 150 mg via INTRAMUSCULAR

## 2018-07-20 ENCOUNTER — Ambulatory Visit (INDEPENDENT_AMBULATORY_CARE_PROVIDER_SITE_OTHER): Payer: Managed Care, Other (non HMO)

## 2018-07-20 ENCOUNTER — Telehealth: Payer: Self-pay | Admitting: Obstetrics and Gynecology

## 2018-07-20 DIAGNOSIS — Z3042 Encounter for surveillance of injectable contraceptive: Secondary | ICD-10-CM | POA: Diagnosis not present

## 2018-07-20 MED ORDER — MEDROXYPROGESTERONE ACETATE 150 MG/ML IM SUSP
150.0000 mg | Freq: Once | INTRAMUSCULAR | Status: AC
  Administered 2018-07-20: 150 mg via INTRAMUSCULAR

## 2018-07-20 NOTE — Telephone Encounter (Signed)
Patient scheduled with [facility] for annual and next depo on 4/6, will need refill on the depo for this appointment.

## 2018-07-23 ENCOUNTER — Other Ambulatory Visit: Payer: Self-pay

## 2018-07-23 DIAGNOSIS — Z3042 Encounter for surveillance of injectable contraceptive: Secondary | ICD-10-CM

## 2018-07-23 MED ORDER — MEDROXYPROGESTERONE ACETATE 150 MG/ML IM SUSY: 150.0000 mg | PREFILLED_SYRINGE | INTRAMUSCULAR | 0 refills | Status: DC

## 2018-07-23 NOTE — Telephone Encounter (Signed)
RF sent, pt aware of it.

## 2018-10-15 ENCOUNTER — Ambulatory Visit: Payer: Managed Care, Other (non HMO) | Admitting: Obstetrics and Gynecology

## 2018-10-15 ENCOUNTER — Ambulatory Visit (INDEPENDENT_AMBULATORY_CARE_PROVIDER_SITE_OTHER): Payer: Managed Care, Other (non HMO)

## 2018-10-15 ENCOUNTER — Other Ambulatory Visit: Payer: Self-pay

## 2018-10-15 DIAGNOSIS — Z3042 Encounter for surveillance of injectable contraceptive: Secondary | ICD-10-CM | POA: Diagnosis not present

## 2018-10-15 MED ORDER — MEDROXYPROGESTERONE ACETATE 150 MG/ML IM SUSP
150.0000 mg | Freq: Once | INTRAMUSCULAR | Status: AC
  Administered 2018-10-15: 150 mg via INTRAMUSCULAR

## 2018-10-15 NOTE — Progress Notes (Signed)
Patient presents for depo provera injection today within dates. Given IM RUOQ. Patient tolerated well.

## 2018-12-31 ENCOUNTER — Other Ambulatory Visit: Payer: Self-pay | Admitting: Obstetrics and Gynecology

## 2018-12-31 ENCOUNTER — Other Ambulatory Visit: Payer: Self-pay

## 2018-12-31 DIAGNOSIS — Z3042 Encounter for surveillance of injectable contraceptive: Secondary | ICD-10-CM

## 2018-12-31 DIAGNOSIS — Z803 Family history of malignant neoplasm of breast: Secondary | ICD-10-CM | POA: Insufficient documentation

## 2018-12-31 MED ORDER — MEDROXYPROGESTERONE ACETATE 150 MG/ML IM SUSY: 150.0000 mg | PREFILLED_SYRINGE | INTRAMUSCULAR | 0 refills | Status: DC

## 2018-12-31 NOTE — Patient Instructions (Addendum)
I value your feedback and entrusting us with your care. If you get a  patient survey, I would appreciate you taking the time to let us know about your experience today. Thank you!  [facility]: [phone]    

## 2018-12-31 NOTE — Progress Notes (Signed)
PCP:  Chad Cordial, PA-C   Chief Complaint  Patient presents with  . Gynecologic Exam    No complaints  . Injections    Depo Provera     HPI:      [name] is a 38 y.o. G2P1001 who LMP was No LMP recorded. Patient has had an injection., presents today for her annual examination.  Her menses are absent with depo. Dysmenorrhea none. She does not have intermenstrual bleeding.  Sex activity: single partner, contraception - Depo-Provera injections. Wants to continue. Last Pap: April 28, 2016  Results were: no abnormalities /neg HPV DNA  Hx of STDs: none  Last mammogram: February 17, 2017  Results were: normal--routine follow-up in 12 months. 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 Jamal Collin. No mammo since. There is a FH of breast cancer in her sister, mat aunt and MGGM. Pt is MyRisk neg 2015. There is a FH of ovarian cancer in her PGM. The patient does do self-breast exams. 2017 IBIS=27%. She is not taking Vit D supp and was slightly deficient in past.  Tobacco use: The patient currently smokes 1/2 packs of cigarettes per day for the past many years. Alcohol use: none No drug use.  Exercise: moderately active  She does get adequate calcium in her diet.  Normal labs 10/17.   Past Medical History:  Diagnosis Date  . Abnormal Pap smear of cervix   . BRCA negative 2015   MyRisk  . Family history of breast cancer   . 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 GERD (gastroesophageal reflux disease)   . Herpes simplex   . Hyperhidrosis   . Increased risk of breast cancer 2017   IBIS=27%  . Vaginal Pap smear, abnormal     Past Surgical History:  Procedure Laterality Date  . BREAST BIOPSY Right 12/25/13   neg  . BREAST LUMPECTOMY Right 05/04/2017    FIBROADENOMA EXCISION BREAST MASS;  Surgeon: Christene Lye, MD;  Location: [facility] ORS;  Service: General;  Laterality: Right;    Family History  Problem Relation Age of Onset  . Breast cancer Maternal  Grandmother 60       breast  . Ovarian cancer Paternal Grandmother        ovarian  . Breast cancer Maternal Aunt 30       breast  . Breast cancer Sister 50       breast    Social History   Socioeconomic History  . Marital status: Married    Spouse name: Not on file  . Number of children: Not on file  . Years of education: Not on file  . Highest education level: Not on file  Occupational History  . Not on file  Social Needs  . Financial resource strain: Not on file  . Food insecurity    Worry: Not on file    Inability: Not on file  . Transportation needs    Medical: Not on file    Non-medical: Not on file  Tobacco Use  . Smoking status: Current Some Day Smoker    Packs/day: 1.00    Years: 10.00    Pack years: 10.00    Types: Cigarettes  . Smokeless tobacco: Never Used  Substance and Sexual Activity  . Alcohol use: Yes    Comment: WINE OCC  . Drug use: No  . Sexual activity: Yes    Birth control/protection: Injection  Lifestyle  . Physical activity    Days  per week: 5 days    Minutes per session: 30 min  . Stress: Not on file  Relationships  . Social Herbalist on phone: Not on file    Gets together: Not on file    Attends religious service: Not on file    Active member of club or organization: Not on file    Attends meetings of clubs or organizations: Not on file    Relationship status: Not on file  . Intimate partner violence    Fear of current or ex partner: Not on file    Emotionally abused: Not on file    Physically abused: Not on file    Forced sexual activity: Not on file  Other Topics Concern  . Not on file  Social History Narrative  . Not on file    Current Meds  Medication Sig  . medroxyPROGESTERone Acetate 150 MG/ML SUSY Inject 1 mL (150 mg total) into the muscle every 3 (three) months.  . [DISCONTINUED] medroxyPROGESTERone Acetate 150 MG/ML SUSY Inject 1 mL (150 mg total) into the muscle every 3 (three) months.     ROS:   Review of Systems  Constitutional: Negative for fatigue, fever and unexpected weight change.  Respiratory: Negative for cough, shortness of breath and wheezing.   Cardiovascular: Negative for chest pain, palpitations and leg swelling.  Gastrointestinal: Negative for blood in stool, constipation, diarrhea, nausea and vomiting.  Endocrine: Negative for cold intolerance, heat intolerance and polyuria.  Genitourinary: Negative for dyspareunia, dysuria, flank pain, frequency, genital sores, hematuria, menstrual problem, pelvic pain, urgency, vaginal bleeding, vaginal discharge and vaginal pain.  Musculoskeletal: Negative for back pain, joint swelling and myalgias.  Skin: Negative for rash.  Neurological: Negative for dizziness, syncope, light-headedness, numbness and headaches.  Hematological: Negative for adenopathy.  Psychiatric/Behavioral: Negative for agitation, confusion, sleep disturbance and suicidal ideas. The patient is not nervous/anxious.      Objective: BP 102/64 (BP Location: Left Arm, Patient Position: Sitting, Cuff Size: Normal)   Pulse (!) 109   Ht 5' 8"  (1.727 m)   Wt 175 lb (79.4 kg)   BMI 26.61 kg/m    Physical Exam Constitutional:      Appearance: She is well-developed.  Genitourinary:     Vulva, vagina, uterus, right adnexa and left adnexa normal.     No vulval lesion or tenderness noted.     No vaginal discharge, erythema or tenderness.     No cervical motion tenderness or polyp.     Uterus is not enlarged or tender.     No right or left adnexal mass present.     Right adnexa not tender.     Left adnexa not tender.  Neck:     Musculoskeletal: Normal range of motion.     Thyroid: No thyromegaly.  Cardiovascular:     Rate and Rhythm: Normal rate and regular rhythm.     Heart sounds: Normal heart sounds. No murmur.  Pulmonary:     Effort: Pulmonary effort is normal.     Breath sounds: Normal breath sounds.  Chest:     Breasts:        Right: No mass,  nipple discharge, skin change or tenderness.        Left: No mass, nipple discharge, skin change or tenderness.  Abdominal:     Palpations: Abdomen is soft.     Tenderness: There is no abdominal tenderness. There is no guarding.  Musculoskeletal: Normal range of motion.  Neurological:  General: No focal deficit present.     Mental Status: She is alert and oriented to person, place, and time.     Cranial Nerves: No cranial nerve deficit.  Skin:    General: Skin is warm and dry.  Psychiatric:        Mood and Affect: Mood normal.        Behavior: Behavior normal.        Thought Content: Thought content normal.        Judgment: Judgment normal.  Vitals signs reviewed.     Assessment/Plan: Encounter for annual routine gynecological examination -   Screening for breast cancer - Plan: MM 3D SCREEN BREAST BILATERAL, Pt to sched mammo  Increased risk of breast cancer - Plan: MM 3D SCREEN BREAST BILATERAL, Cont monthly SBE, yearly CBE and mammos, discussed yearly scr breast MRI. Pt to call within 6 months of mammo if desires. Add Vit D supp.  Family history of breast cancer - Plan: MM 3D SCREEN BREAST BILATERAL, MyRisk neg  Encounter for surveillance of injectable contraceptive - Plan: medroxyPROGESTERone Acetate 150 MG/ML SUSY, Rx RF depo. Cont ca/Vit D  Encounter for Depo-Provera contraception - Plan: medroxyPROGESTERone (DEPO-PROVERA) injection 150 mg,   Meds ordered this encounter  Medications  . medroxyPROGESTERone (DEPO-PROVERA) injection 150 mg  . medroxyPROGESTERone Acetate 150 MG/ML SUSY    Sig: Inject 1 mL (150 mg total) into the muscle every 3 (three) months.    Dispense:  1 mL    Refill:  3    DX Code Needed  .    Order Specific Question:   Supervising Provider    Answer:   Gae Dry [[id number]]             GYN counsel breast self exam, mammography screening, adequate intake of calcium and vitamin D, diet and exercise     F/U  Return in about 1 year (around  01/01/2020).   Alicia B. Copland, PA-C 01/01/2019 4:40 PM

## 2019-01-01 ENCOUNTER — Ambulatory Visit (INDEPENDENT_AMBULATORY_CARE_PROVIDER_SITE_OTHER): Payer: Managed Care, Other (non HMO) | Admitting: Obstetrics and Gynecology

## 2019-01-01 ENCOUNTER — Other Ambulatory Visit: Payer: Self-pay

## 2019-01-01 ENCOUNTER — Encounter: Payer: Self-pay | Admitting: Obstetrics and Gynecology

## 2019-01-01 VITALS — BP 102/64 | HR 109 | Ht 68.0 in | Wt 175.0 lb

## 2019-01-01 DIAGNOSIS — Z803 Family history of malignant neoplasm of breast: Secondary | ICD-10-CM

## 2019-01-01 DIAGNOSIS — Z01419 Encounter for gynecological examination (general) (routine) without abnormal findings: Secondary | ICD-10-CM

## 2019-01-01 DIAGNOSIS — Z9189 Other specified personal risk factors, not elsewhere classified: Secondary | ICD-10-CM

## 2019-01-01 DIAGNOSIS — Z1239 Encounter for other screening for malignant neoplasm of breast: Secondary | ICD-10-CM

## 2019-01-01 DIAGNOSIS — Z3042 Encounter for surveillance of injectable contraceptive: Secondary | ICD-10-CM

## 2019-01-01 MED ORDER — MEDROXYPROGESTERONE ACETATE 150 MG/ML IM SUSY: 150.0000 mg | PREFILLED_SYRINGE | INTRAMUSCULAR | 3 refills | Status: DC

## 2019-01-01 MED ORDER — MEDROXYPROGESTERONE ACETATE 150 MG/ML IM SUSP
150.0000 mg | Freq: Once | INTRAMUSCULAR | Status: AC
  Administered 2019-01-01: 150 mg via INTRAMUSCULAR

## 2019-01-01 NOTE — Progress Notes (Signed)
Patient presents for depo provera injection today within dates. Given IM LUOQ. Patient tolerated well. 

## 2019-03-26 ENCOUNTER — Ambulatory Visit (INDEPENDENT_AMBULATORY_CARE_PROVIDER_SITE_OTHER): Payer: Managed Care, Other (non HMO)

## 2019-03-26 ENCOUNTER — Other Ambulatory Visit: Payer: Self-pay

## 2019-03-26 DIAGNOSIS — Z3042 Encounter for surveillance of injectable contraceptive: Secondary | ICD-10-CM | POA: Diagnosis not present

## 2019-03-26 MED ORDER — MEDROXYPROGESTERONE ACETATE 150 MG/ML IM SUSP
150.0000 mg | Freq: Once | INTRAMUSCULAR | Status: AC
  Administered 2019-03-26: 150 mg via INTRAMUSCULAR

## 2019-03-26 NOTE — Progress Notes (Signed)
Patient presents for depo provera injection today within dates. Given IM RUOQ. Patient tolerated well. 

## 2019-06-18 ENCOUNTER — Other Ambulatory Visit: Payer: Self-pay

## 2019-06-18 ENCOUNTER — Ambulatory Visit (INDEPENDENT_AMBULATORY_CARE_PROVIDER_SITE_OTHER): Payer: Managed Care, Other (non HMO)

## 2019-06-18 DIAGNOSIS — Z3042 Encounter for surveillance of injectable contraceptive: Secondary | ICD-10-CM

## 2019-06-18 MED ORDER — MEDROXYPROGESTERONE ACETATE 150 MG/ML IM SUSP
150.0000 mg | Freq: Once | INTRAMUSCULAR | Status: AC
  Administered 2019-06-18: 150 mg via INTRAMUSCULAR

## 2019-06-18 NOTE — Progress Notes (Signed)
Pt here for depo injj which was given IM right glut.  NDC# [phone]

## 2019-09-11 ENCOUNTER — Other Ambulatory Visit: Payer: Self-pay

## 2019-09-11 ENCOUNTER — Ambulatory Visit (INDEPENDENT_AMBULATORY_CARE_PROVIDER_SITE_OTHER): Payer: Managed Care, Other (non HMO)

## 2019-09-11 DIAGNOSIS — Z3042 Encounter for surveillance of injectable contraceptive: Secondary | ICD-10-CM

## 2019-09-11 MED ORDER — MEDROXYPROGESTERONE ACETATE 150 MG/ML IM SUSP
150.0000 mg | Freq: Once | INTRAMUSCULAR | Status: AC
  Administered 2019-09-11: 16:00:00 150 mg via INTRAMUSCULAR

## 2019-09-11 NOTE — Progress Notes (Signed)
Patient presents today for Depo Provera injection within dates. Given IM LUOQ. Patient tolerated well. 

## 2019-11-12 ENCOUNTER — Encounter: Payer: Self-pay | Admitting: Obstetrics and Gynecology

## 2019-11-12 ENCOUNTER — Other Ambulatory Visit: Payer: Self-pay

## 2019-11-12 ENCOUNTER — Ambulatory Visit (INDEPENDENT_AMBULATORY_CARE_PROVIDER_SITE_OTHER): Payer: Managed Care, Other (non HMO) | Admitting: Obstetrics and Gynecology

## 2019-11-12 VITALS — BP 120/80 | Ht 67.0 in | Wt 160.0 lb

## 2019-11-12 DIAGNOSIS — L0231 Cutaneous abscess of buttock: Secondary | ICD-10-CM | POA: Diagnosis not present

## 2019-11-12 MED ORDER — DOXYCYCLINE HYCLATE 100 MG PO CAPS: 100.0000 mg | ORAL_CAPSULE | Freq: Two times a day (BID) | ORAL | 0 refills | Status: DC

## 2019-11-12 NOTE — Patient Instructions (Signed)
I value your feedback and entrusting us with your care. If you get a [facility] patient survey, I would appreciate you taking the time to let us know about your experience today. Thank you!  As of June 20, 2019, your lab results will be released to your MyChart immediately, before I even have a chance to see them. Please give me time to review them and contact you if there are any abnormalities. Thank you for your patience.  

## 2019-11-12 NOTE — Progress Notes (Signed)
Abdulahi Schor, Deirdre Evener, PA-C   Chief Complaint  Patient presents with  . Lump on Tail bone    hard lump x 1 week    HPI:      [name] is a 39 y.o. G2P1001 who LMP was No LMP recorded. Patient has had an injection., presents today for skin abscess on buttocks for the past wk. Area was tender initially and now becoming larger, more painful and red. Has chills recently, no fever. No d/c. 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 No hx of pilonidal abscess/cyst. Taking warm showers.    Past Medical History:  Diagnosis Date  . Abnormal Pap smear of cervix   . BRCA negative 2015   MyRisk  . Family history of breast cancer   . 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 GERD (gastroesophageal reflux disease)   . Herpes simplex   . Hyperhidrosis   . Increased risk of breast cancer 2017   IBIS=27%  . Vaginal Pap smear, abnormal     Past Surgical History:  Procedure Laterality Date  . BREAST BIOPSY Right 12/25/13   neg  . BREAST LUMPECTOMY Right 05/04/2017    FIBROADENOMA EXCISION BREAST MASS;  Surgeon: Christene Lye, MD;  Location: [facility] ORS;  Service: General;  Laterality: Right;    Family History  Problem Relation Age of Onset  . Breast cancer Maternal Grandmother 60       breast  . Ovarian cancer Paternal Grandmother        ovarian  . Breast cancer Maternal Aunt 48       breast  . Breast cancer Sister 89       breast    Social History   Socioeconomic History  . Marital status: Married    Spouse name: Not on file  . Number of children: Not on file  . Years of education: Not on file  . Highest education level: Not on file  Occupational History  . Not on file  Tobacco Use  . Smoking status: Current Some Day Smoker    Packs/day: 1.00    Years: 10.00    Pack years: 10.00    Types: Cigarettes  . Smokeless tobacco: Never Used  Substance and Sexual Activity  . Alcohol use: Yes    Comment: WINE OCC  . Drug use: No  . Sexual activity: Yes    Birth  control/protection: Injection  Other Topics Concern  . Not on file  Social History Narrative  . Not on file   Social Determinants of Health   Financial Resource Strain:   . Difficulty of Paying Living Expenses:   Food Insecurity:   . Worried About Charity fundraiser in the Last Year:   . Arboriculturist in the Last Year:   Transportation Needs:   . Film/video editor (Medical):   Marland Kitchen Lack of Transportation (Non-Medical):   Physical Activity: Sufficiently Active  . Days of Exercise per Week: 5 days  . Minutes of Exercise per Session: 30 min  Stress:   . Feeling of Stress :   Social Connections:   . Frequency of Communication with Friends and Family:   . Frequency of Social Gatherings with Friends and Family:   . Attends Religious Services:   . Active Member of Clubs or Organizations:   . Attends Archivist Meetings:   Marland Kitchen Marital Status:   Intimate Partner Violence:   . Fear of Current or Ex-Partner:   .  Emotionally Abused:   Marland Kitchen Physically Abused:   . Sexually Abused:     Outpatient Medications Prior to Visit  Medication Sig Dispense Refill  . medroxyPROGESTERone Acetate 150 MG/ML SUSY Inject 1 mL (150 mg total) into the muscle every 3 (three) months. 1 mL 3   Facility-Administered Medications Prior to Visit  Medication Dose Route Frequency Provider Last Rate Last Admin  . bupivacaine (PF) (MARCAINE) 0.25 % injection    Anesthesia Intra-op Doreen Salvage, CRNA   4 mL at 11/21/14 1218  . lidocaine-EPINEPHrine 1.5 %-1:200000 injection    Anesthesia Intra-op Doreen Salvage, CRNA   3 mL at 11/21/14 1210      ROS:  Review of Systems  Constitutional: Negative for fever.  Gastrointestinal: Negative for blood in stool, constipation, diarrhea, nausea and vomiting.  Genitourinary: Negative for dyspareunia, dysuria, flank pain, frequency, hematuria, urgency, vaginal bleeding, vaginal discharge and vaginal pain.  Musculoskeletal: Negative for back pain.  Skin:  Positive for color change and wound. Negative for rash.    OBJECTIVE:   Vitals:  BP 120/80   Ht 5' 7"  (1.702 m)   Wt 160 lb (72.6 kg)   Breastfeeding No   BMI 25.06 kg/m   Physical Exam Vitals reviewed.  Constitutional:      Appearance: She is well-developed.  Pulmonary:     Effort: Pulmonary effort is normal.  Musculoskeletal:        General: Normal range of motion.     Cervical back: Normal range of motion.  Skin:    General: Skin is warm and dry.       Neurological:     General: No focal deficit present.     Mental Status: She is alert and oriented to person, place, and time.     Cranial Nerves: No cranial nerve deficit.  Psychiatric:        Mood and Affect: Mood normal.        Behavior: Behavior normal.        Thought Content: Thought content normal.        Judgment: Judgment normal.    Assessment/Plan: Cutaneous abscess of buttock - Plan: doxycycline (VIBRAMYCIN) 100 MG capsule; Looks like skin abscess and not pilonidal abscess. Rx doxy, cont sitz baths. F/u if sx worsen for gen surg ref for I&D.    Meds ordered this encounter  Medications  . doxycycline (VIBRAMYCIN) 100 MG capsule    Sig: Take 1 capsule (100 mg total) by mouth 2 (two) times daily for 10 days.    Dispense:  20 capsule    Refill:  0    Order Specific Question:   Supervising Provider    Answer:   Gae Dry [[id number]]      Return if symptoms worsen or fail to improve.  Kenton Fortin B. Takeesha Isley, PA-C 11/12/2019 2:54 PM

## 2019-11-14 ENCOUNTER — Encounter: Payer: Self-pay | Admitting: Obstetrics and Gynecology

## 2019-12-04 ENCOUNTER — Other Ambulatory Visit: Payer: Self-pay

## 2019-12-04 ENCOUNTER — Ambulatory Visit (INDEPENDENT_AMBULATORY_CARE_PROVIDER_SITE_OTHER): Payer: Managed Care, Other (non HMO)

## 2019-12-04 DIAGNOSIS — Z3042 Encounter for surveillance of injectable contraceptive: Secondary | ICD-10-CM

## 2019-12-04 MED ORDER — MEDROXYPROGESTERONE ACETATE 150 MG/ML IM SUSP
150.0000 mg | Freq: Once | INTRAMUSCULAR | Status: AC
  Administered 2019-12-04: 150 mg via INTRAMUSCULAR

## 2019-12-04 NOTE — Progress Notes (Signed)
Patient presents for depo provera injection today within dates. Given IM RUOQ. Patient tolerated well.

## 2020-02-19 ENCOUNTER — Telehealth: Payer: Self-pay | Admitting: Obstetrics and Gynecology

## 2020-02-19 DIAGNOSIS — Z0000 Encounter for general adult medical examination without abnormal findings: Secondary | ICD-10-CM

## 2020-02-19 DIAGNOSIS — Z13220 Encounter for screening for lipoid disorders: Secondary | ICD-10-CM

## 2020-02-19 DIAGNOSIS — Z131 Encounter for screening for diabetes mellitus: Secondary | ICD-10-CM

## 2020-02-19 NOTE — Telephone Encounter (Signed)
Patient is coming in on 02/26/2020 and is needing to have labs done. Could we get order put in for her to have those drawn.   Thanks

## 2020-02-19 NOTE — Telephone Encounter (Signed)
Tried to call pt to see waht labs she is wanting. No answer/LM. fwding to [facility] so she knows

## 2020-02-20 NOTE — Telephone Encounter (Signed)
Pt states its the wellness form for LC. So cholesterol, HDL, LDL, A1C, glucose, and covid antibodies.

## 2020-02-20 NOTE — Telephone Encounter (Signed)
Pls confirm with pt what labs. She is [employer] employee so may be for the form. Thx

## 2020-02-21 NOTE — Addendum Note (Signed)
Addended by: Ardeth Perfect B on: 4/35/6861 08:59 AM   Modules accepted: Orders

## 2020-02-21 NOTE — Telephone Encounter (Signed)
Pt aware. Apt already scheduled 330 lab. R/S to 9:20 d/t fasting. Depo injection apt also changed to 9am

## 2020-02-21 NOTE — Addendum Note (Signed)
Addended by: Ardeth Perfect B on: 0/34/9611 08:32 AM   Modules accepted: Orders

## 2020-02-21 NOTE — Telephone Encounter (Signed)
Lab orders placed. Pt  needs to sched fasting lab appt. Will f/u with results.

## 2020-02-25 ENCOUNTER — Encounter: Payer: Self-pay | Admitting: Obstetrics and Gynecology

## 2020-02-25 ENCOUNTER — Other Ambulatory Visit: Payer: Self-pay | Admitting: Obstetrics and Gynecology

## 2020-02-25 DIAGNOSIS — Z3042 Encounter for surveillance of injectable contraceptive: Secondary | ICD-10-CM

## 2020-02-25 MED ORDER — MEDROXYPROGESTERONE ACETATE 150 MG/ML IM SUSY: 150.0000 mg | PREFILLED_SYRINGE | INTRAMUSCULAR | 0 refills | Status: DC

## 2020-02-25 NOTE — Telephone Encounter (Signed)
Pt is now scheduled for ean. Has depo appointment tomorrow, please refill.

## 2020-02-25 NOTE — Addendum Note (Signed)
Addended by: Drenda Freeze on: 02/25/2020 01:21 PM   Modules accepted: Orders

## 2020-02-26 ENCOUNTER — Other Ambulatory Visit: Payer: Self-pay

## 2020-02-26 ENCOUNTER — Other Ambulatory Visit: Payer: Managed Care, Other (non HMO)

## 2020-02-26 ENCOUNTER — Ambulatory Visit (INDEPENDENT_AMBULATORY_CARE_PROVIDER_SITE_OTHER): Payer: Managed Care, Other (non HMO)

## 2020-02-26 DIAGNOSIS — Z3042 Encounter for surveillance of injectable contraceptive: Secondary | ICD-10-CM | POA: Diagnosis not present

## 2020-02-26 DIAGNOSIS — Z131 Encounter for screening for diabetes mellitus: Secondary | ICD-10-CM

## 2020-02-26 DIAGNOSIS — Z0000 Encounter for general adult medical examination without abnormal findings: Secondary | ICD-10-CM

## 2020-02-26 DIAGNOSIS — Z13220 Encounter for screening for lipoid disorders: Secondary | ICD-10-CM

## 2020-02-26 MED ORDER — MEDROXYPROGESTERONE ACETATE 150 MG/ML IM SUSP
150.0000 mg | Freq: Once | INTRAMUSCULAR | Status: AC
Start: 2020-02-26 — End: 2020-02-26
  Administered 2020-02-26: 150 mg via INTRAMUSCULAR

## 2020-02-26 NOTE — Progress Notes (Signed)
Patient presents for depo provera injection today within dates. Given IM LUOQ. Patient tolerated well.

## 2020-02-27 LAB — COMPREHENSIVE METABOLIC PANEL
ALT: 9 IU/L (ref 0–32)
AST: 11 IU/L (ref 0–40)
Albumin/Globulin Ratio: 2.2 (ref 1.2–2.2)
Albumin: 4.8 g/dL (ref 3.8–4.8)
Alkaline Phosphatase: 58 IU/L (ref 48–121)
BUN/Creatinine Ratio: 8 — ABNORMAL LOW (ref 9–23)
BUN: 7 mg/dL (ref 6–20)
Bilirubin Total: 0.6 mg/dL (ref 0.0–1.2)
CO2: 21 mmol/L (ref 20–29)
Calcium: 9.3 mg/dL (ref 8.7–10.2)
Chloride: 106 mmol/L (ref 96–106)
Creatinine, Ser: 0.83 mg/dL (ref 0.57–1.00)
GFR calc Af Amer: 103 mL/min/{1.73_m2} (ref >59)
GFR calc non Af Amer: 90 mL/min/{1.73_m2} (ref >59)
Globulin, Total: 2.2 g/dL (ref 1.5–4.5)
Glucose: 103 mg/dL — ABNORMAL HIGH (ref 65–99)
Potassium: 4.4 mmol/L (ref 3.5–5.2)
Sodium: 141 mmol/L (ref 134–144)
Total Protein: 7 g/dL (ref 6.0–8.5)

## 2020-02-27 LAB — LIPID PANEL
Chol/HDL Ratio: 2.8 ratio (ref 0.0–4.4)
Cholesterol, Total: 147 mg/dL (ref 100–199)
HDL: 52 mg/dL (ref >39)
LDL Chol Calc (NIH): 84 mg/dL (ref 0–99)
Triglycerides: 49 mg/dL (ref 0–149)
VLDL Cholesterol Cal: 11 mg/dL (ref 5–40)

## 2020-02-27 LAB — SARS-COV-2 SEMI-QUANTITATIVE TOTAL ANTIBODY, SPIKE
SARS-CoV-2 Semi-Quant Total Ab: <0.4 U/mL (ref <0.8)
SARS-CoV-2 Spike Ab Interp: NEGATIVE

## 2020-02-27 LAB — HEMOGLOBIN A1C
Est. average glucose Bld gHb Est-mCnc: 111 mg/dL
Hgb A1c MFr Bld: 5.5 % (ref 4.8–5.6)

## 2020-03-24 ENCOUNTER — Ambulatory Visit: Payer: Managed Care, Other (non HMO) | Admitting: Obstetrics and Gynecology

## 2020-05-17 ENCOUNTER — Other Ambulatory Visit: Payer: Self-pay | Admitting: Obstetrics and Gynecology

## 2020-05-17 DIAGNOSIS — Z3042 Encounter for surveillance of injectable contraceptive: Secondary | ICD-10-CM

## 2020-05-20 ENCOUNTER — Encounter: Payer: Self-pay | Admitting: Obstetrics and Gynecology

## 2020-05-20 ENCOUNTER — Other Ambulatory Visit: Payer: Self-pay

## 2020-05-20 ENCOUNTER — Ambulatory Visit (INDEPENDENT_AMBULATORY_CARE_PROVIDER_SITE_OTHER): Payer: Managed Care, Other (non HMO) | Admitting: Obstetrics and Gynecology

## 2020-05-20 VITALS — BP 126/74 | Ht 67.0 in | Wt 153.0 lb

## 2020-05-20 DIAGNOSIS — Z01419 Encounter for gynecological examination (general) (routine) without abnormal findings: Secondary | ICD-10-CM

## 2020-05-20 DIAGNOSIS — Z1151 Encounter for screening for human papillomavirus (HPV): Secondary | ICD-10-CM

## 2020-05-20 DIAGNOSIS — Z124 Encounter for screening for malignant neoplasm of cervix: Secondary | ICD-10-CM

## 2020-05-20 DIAGNOSIS — Z9189 Other specified personal risk factors, not elsewhere classified: Secondary | ICD-10-CM

## 2020-05-20 DIAGNOSIS — Z3042 Encounter for surveillance of injectable contraceptive: Secondary | ICD-10-CM

## 2020-05-20 DIAGNOSIS — Z803 Family history of malignant neoplasm of breast: Secondary | ICD-10-CM

## 2020-05-20 DIAGNOSIS — Z1231 Encounter for screening mammogram for malignant neoplasm of breast: Secondary | ICD-10-CM

## 2020-05-20 MED ORDER — MEDROXYPROGESTERONE ACETATE 150 MG/ML IM SUSP
150.0000 mg | Freq: Once | INTRAMUSCULAR | Status: AC
Start: 2020-05-20 — End: 2020-05-20
  Administered 2020-05-20: 150 mg via INTRAMUSCULAR

## 2020-05-20 MED ORDER — MEDROXYPROGESTERONE ACETATE 150 MG/ML IM SUSY: 150.0000 mg | PREFILLED_SYRINGE | INTRAMUSCULAR | 3 refills | Status: DC

## 2020-05-20 NOTE — Addendum Note (Signed)
Addended by: Brien Few on: 05/20/2020 04:46 PM   Modules accepted: Orders

## 2020-05-20 NOTE — Patient Instructions (Addendum)
I value your feedback and entrusting us with your care. If you get a [facility] patient survey, I would appreciate you taking the time to let us know about your experience today. Thank you!  As of June 20, 2019, your lab results will be released to your MyChart immediately, before I even have a chance to see them. Please give me time to review them and contact you if there are any abnormalities. Thank you for your patience.   [facility]: [phone]   [facility]: [phone]  

## 2020-05-20 NOTE — Progress Notes (Signed)
PCP:  Chad Cordial, PA-C   Chief Complaint  Patient presents with  . Annual Exam     HPI:      Lynn Nguyen is a 39 y.o. G2P1001 who LMP was No LMP recorded. Patient has had an injection., presents today for her annual examination. Her menses are absent with depo. Dysmenorrhea none. She does not have intermenstrual bleeding.  Sex activity: single partner, contraception - Depo-Provera injections. Wants to continue. Last Pap: April 28, 2016  Results were: no abnormalities /neg HPV DNA  Hx of STDs: none  Last mammogram: February 17, 2017  Results were: normal--routine follow-up in 12 months. 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 Jamal Collin. No mammo since. There is a FH of breast cancer in her sister, mat aunt and MGGM. Pt is MyRisk neg 2015. There is a FH of ovarian cancer in her PGM. The patient does do self-breast exams. 2017 IBIS=27%. She is not taking Vit D supp and was slightly deficient in past.  Tobacco use: The patient currently smokes 1/2 packs of cigarettes per day for the past many years. Alcohol use: none No drug use.  Exercise: moderately active  She does get adequate calcium in her diet. Not taking Vit D supp.   Normal labs 8/21  Past Medical History:  Diagnosis Date  . Abnormal Pap smear of cervix   . BRCA negative 2015   MyRisk  . Family history of breast cancer   . 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 GERD (gastroesophageal reflux disease)   . Herpes simplex   . Hyperhidrosis   . Increased risk of breast cancer 2017   IBIS=27%  . Vaginal Pap smear, abnormal     Past Surgical History:  Procedure Laterality Date  . BREAST BIOPSY Right 12/25/13   neg  . BREAST LUMPECTOMY Right 05/04/2017    FIBROADENOMA EXCISION BREAST MASS;  Surgeon: Christene Lye, MD;  Location: [facility] ORS;  Service: General;  Laterality: Right;    Family History  Problem Relation Age of Onset  . Breast cancer Maternal Grandmother 60       breast  .  Ovarian cancer Paternal Grandmother        ovarian  . Breast cancer Maternal Aunt 77       breast  . Breast cancer Sister 13       breast    Social History   Socioeconomic History  . Marital status: Married    Spouse name: Not on file  . Number of children: Not on file  . Years of education: Not on file  . Highest education level: Not on file  Occupational History  . Not on file  Tobacco Use  . Smoking status: Current Some Day Smoker    Packs/day: 1.00    Years: 10.00    Pack years: 10.00    Types: Cigarettes  . Smokeless tobacco: Never Used  Vaping Use  . Vaping Use: Never used  Substance and Sexual Activity  . Alcohol use: Yes    Comment: WINE OCC  . Drug use: No  . Sexual activity: Yes    Birth control/protection: Injection  Other Topics Concern  . Not on file  Social History Narrative  . Not on file   Social Determinants of Health   Financial Resource Strain:   . Difficulty of Paying Living Expenses: Not on file  Food Insecurity:   . Worried About Charity fundraiser in the Last Year: Not  on file  . Ran Out of Food in the Last Year: Not on file  Transportation Needs:   . Lack of Transportation (Medical): Not on file  . Lack of Transportation (Non-Medical): Not on file  Physical Activity:   . Days of Exercise per Week: Not on file  . Minutes of Exercise per Session: Not on file  Stress:   . Feeling of Stress : Not on file  Social Connections:   . Frequency of Communication with Friends and Family: Not on file  . Frequency of Social Gatherings with Friends and Family: Not on file  . Attends Religious Services: Not on file  . Active Member of Clubs or Organizations: Not on file  . Attends Archivist Meetings: Not on file  . Marital Status: Not on file  Intimate Partner Violence:   . Fear of Current or Ex-Partner: Not on file  . Emotionally Abused: Not on file  . Physically Abused: Not on file  . Sexually Abused: Not on file    No  outpatient medications have been marked as taking for the 05/20/20 encounter (Office Visit) with Donnald Tabar, Deirdre Evener, PA-C.     ROS:  Review of Systems  Constitutional: Negative for fatigue, fever and unexpected weight change.  Respiratory: Negative for cough, shortness of breath and wheezing.   Cardiovascular: Negative for chest pain, palpitations and leg swelling.  Gastrointestinal: Negative for blood in stool, constipation, diarrhea, nausea and vomiting.  Endocrine: Negative for cold intolerance, heat intolerance and polyuria.  Genitourinary: Negative for dyspareunia, dysuria, flank pain, frequency, genital sores, hematuria, menstrual problem, pelvic pain, urgency, vaginal bleeding, vaginal discharge and vaginal pain.  Musculoskeletal: Negative for back pain, joint swelling and myalgias.  Skin: Negative for rash.  Neurological: Negative for dizziness, syncope, light-headedness, numbness and headaches.  Hematological: Negative for adenopathy.  Psychiatric/Behavioral: Negative for agitation, confusion, sleep disturbance and suicidal ideas. The patient is not nervous/anxious.      Objective: BP 126/74   Ht 5' 7"  (1.702 m)   Wt 153 lb (69.4 kg)   BMI 23.96 kg/m    Physical Exam Constitutional:      Appearance: She is well-developed.  Genitourinary:     Vulva, vagina, uterus, right adnexa and left adnexa normal.     No vulval lesion or tenderness noted.     No vaginal discharge, erythema or tenderness.     No cervical motion tenderness or polyp.     Uterus is not enlarged or tender.     No right or left adnexal mass present.     Right adnexa not tender.     Left adnexa not tender.  Neck:     Thyroid: No thyromegaly.  Cardiovascular:     Rate and Rhythm: Normal rate and regular rhythm.     Heart sounds: Normal heart sounds. No murmur heard.   Pulmonary:     Effort: Pulmonary effort is normal.     Breath sounds: Normal breath sounds.  Chest:     Breasts:        Right: No  mass, nipple discharge, skin change or tenderness.        Left: No mass, nipple discharge, skin change or tenderness.  Abdominal:     Palpations: Abdomen is soft.     Tenderness: There is no abdominal tenderness. There is no guarding.  Musculoskeletal:        General: Normal range of motion.     Cervical back: Normal range of motion.  Neurological:  General: No focal deficit present.     Mental Status: She is alert and oriented to person, place, and time.     Cranial Nerves: No cranial nerve deficit.  Skin:    General: Skin is warm and dry.  Psychiatric:        Mood and Affect: Mood normal.        Behavior: Behavior normal.        Thought Content: Thought content normal.        Judgment: Judgment normal.  Vitals reviewed.     Assessment/Plan: Encounter for annual routine gynecological examination  Cervical cancer screening - Plan: IGP, Aptima HPV  Screening for HPV (human papillomavirus) - Plan: IGP, Aptima HPV  Encounter for surveillance of injectable contraceptive - Plan: medroxyPROGESTERone Acetate 150 MG/ML SUSY; depo RF. Cont ca/Add Vit D  Encounter for screening mammogram for malignant neoplasm of breast - Plan: MM 3D SCREEN BREAST BILATERAL; pt to sched mammo  Family history of breast cancer - Plan: MM 3D SCREEN BREAST BILATERAL; Pt is MyRisk neg.   Increased risk of breast cancer - Plan: MM 3D SCREEN BREAST BILATERAL; pt aware of monthly SBE, yearly CBE and mammos, and scr breast MRI. Will call for MRI in spring if desires. Add Vit D supp    Meds ordered this encounter  Medications  . medroxyPROGESTERone Acetate 150 MG/ML SUSY    Sig: Inject 1 mL (150 mg total) into the muscle every 3 (three) months.    Dispense:  1 mL    Refill:  3    Order Specific Question:   Supervising Provider    Answer:   Gae Dry [[id number]]             GYN counsel breast self exam, mammography screening, adequate intake of calcium and vitamin D, diet and exercise      F/U  Return in about 1 year (around 05/20/2021).   Lynn Hodapp B. Tamas Suen, PA-C 05/20/2020 3:46 PM

## 2020-05-23 LAB — IGP, APTIMA HPV: HPV Aptima: NEGATIVE

## 2020-06-24 ENCOUNTER — Other Ambulatory Visit: Payer: Self-pay | Admitting: Obstetrics and Gynecology

## 2020-06-24 ENCOUNTER — Telehealth: Payer: Self-pay | Admitting: Obstetrics and Gynecology

## 2020-06-24 DIAGNOSIS — L0231 Cutaneous abscess of buttock: Secondary | ICD-10-CM

## 2020-06-24 MED ORDER — DOXYCYCLINE HYCLATE 100 MG PO CAPS: 100.0000 mg | ORAL_CAPSULE | Freq: Two times a day (BID) | ORAL | 0 refills | Status: DC

## 2020-06-24 NOTE — Telephone Encounter (Signed)
Patient has another boil/cyst and would like to have meds called in. Would like meds called [facility].   CB# [phone]

## 2020-06-24 NOTE — Progress Notes (Signed)
Rx RF doxy for recurrent skin abscess

## 2020-06-24 NOTE — Telephone Encounter (Signed)
Called pt, no answer, LVMTRC. 

## 2020-06-24 NOTE — Telephone Encounter (Signed)
Pt aware.

## 2020-06-24 NOTE — Telephone Encounter (Signed)
Rx eRxd. Make sure to do sitz baths or warm compresses. F/u prn.

## 2020-08-12 ENCOUNTER — Ambulatory Visit (INDEPENDENT_AMBULATORY_CARE_PROVIDER_SITE_OTHER): Payer: Managed Care, Other (non HMO)

## 2020-08-12 ENCOUNTER — Other Ambulatory Visit: Payer: Self-pay

## 2020-08-12 DIAGNOSIS — Z3042 Encounter for surveillance of injectable contraceptive: Secondary | ICD-10-CM

## 2020-08-12 MED ORDER — MEDROXYPROGESTERONE ACETATE 150 MG/ML IM SUSP
150.0000 mg | Freq: Once | INTRAMUSCULAR | Status: AC
  Administered 2020-08-12: 150 mg via INTRAMUSCULAR

## 2020-08-12 NOTE — Progress Notes (Signed)
Pt here for depo which was given IM right glut.  NDC# [phone]

## 2020-11-04 ENCOUNTER — Ambulatory Visit (INDEPENDENT_AMBULATORY_CARE_PROVIDER_SITE_OTHER): Payer: Managed Care, Other (non HMO)

## 2020-11-04 ENCOUNTER — Other Ambulatory Visit: Payer: Self-pay

## 2020-11-04 DIAGNOSIS — Z3042 Encounter for surveillance of injectable contraceptive: Secondary | ICD-10-CM

## 2020-11-04 MED ORDER — MEDROXYPROGESTERONE ACETATE 150 MG/ML IM SUSP
150.0000 mg | Freq: Once | INTRAMUSCULAR | Status: AC
  Administered 2020-11-04: 150 mg via INTRAMUSCULAR

## 2020-11-04 NOTE — Progress Notes (Signed)
Pt here for depo which was given IM right glut.  NDCE [phone]

## 2021-01-27 ENCOUNTER — Ambulatory Visit: Payer: Managed Care, Other (non HMO)

## 2021-01-27 ENCOUNTER — Other Ambulatory Visit: Payer: Self-pay

## 2021-01-27 ENCOUNTER — Ambulatory Visit (INDEPENDENT_AMBULATORY_CARE_PROVIDER_SITE_OTHER): Payer: Managed Care, Other (non HMO)

## 2021-01-27 DIAGNOSIS — Z3042 Encounter for surveillance of injectable contraceptive: Secondary | ICD-10-CM | POA: Diagnosis not present

## 2021-01-27 MED ORDER — MEDROXYPROGESTERONE ACETATE 150 MG/ML IM SUSP
150.0000 mg | Freq: Once | INTRAMUSCULAR | Status: AC
  Administered 2021-01-27: 150 mg via INTRAMUSCULAR

## 2021-01-27 NOTE — Progress Notes (Signed)
Pt here for depo which was given IM right glut.  NDC# [phone]

## 2021-04-21 ENCOUNTER — Ambulatory Visit: Payer: Managed Care, Other (non HMO)

## 2021-04-21 ENCOUNTER — Other Ambulatory Visit: Payer: Self-pay

## 2021-04-21 MED ORDER — MEDROXYPROGESTERONE ACETATE 150 MG/ML IM SUSP
150.0000 mg | Freq: Once | INTRAMUSCULAR | Status: AC
  Administered 2021-04-21: 150 mg via INTRAMUSCULAR

## 2021-04-21 NOTE — Progress Notes (Unsigned)
Patient presents today for Depo Provera injection within dates. Given IM Lt UOQ. Patient tolerated well. NDC# [phone]. Lot# [id number] Exp 03/10/25. DRL

## 2021-07-13 ENCOUNTER — Other Ambulatory Visit: Payer: Self-pay | Admitting: Obstetrics and Gynecology

## 2021-07-13 ENCOUNTER — Other Ambulatory Visit: Payer: Self-pay

## 2021-07-13 DIAGNOSIS — Z3042 Encounter for surveillance of injectable contraceptive: Secondary | ICD-10-CM

## 2021-07-13 MED ORDER — MEDROXYPROGESTERONE ACETATE 150 MG/ML IM SUSY: 150.0000 mg | PREFILLED_SYRINGE | INTRAMUSCULAR | 3 refills | Status: DC

## 2021-07-13 NOTE — Telephone Encounter (Signed)
Pt called for refill on depo, her appointment is tomorrow with [facility]. Pt aware rx sent in.

## 2021-07-14 ENCOUNTER — Other Ambulatory Visit: Payer: Self-pay

## 2021-07-14 ENCOUNTER — Encounter: Payer: Self-pay | Admitting: Obstetrics and Gynecology

## 2021-07-14 ENCOUNTER — Ambulatory Visit (INDEPENDENT_AMBULATORY_CARE_PROVIDER_SITE_OTHER): Payer: Managed Care, Other (non HMO) | Admitting: Obstetrics and Gynecology

## 2021-07-14 ENCOUNTER — Ambulatory Visit: Payer: Managed Care, Other (non HMO)

## 2021-07-14 VITALS — BP 110/60 | Resp 16 | Wt 168.0 lb

## 2021-07-14 DIAGNOSIS — Z1231 Encounter for screening mammogram for malignant neoplasm of breast: Secondary | ICD-10-CM | POA: Diagnosis not present

## 2021-07-14 DIAGNOSIS — N6312 Unspecified lump in the right breast, upper inner quadrant: Secondary | ICD-10-CM

## 2021-07-14 DIAGNOSIS — Z01419 Encounter for gynecological examination (general) (routine) without abnormal findings: Secondary | ICD-10-CM | POA: Diagnosis not present

## 2021-07-14 DIAGNOSIS — Z3042 Encounter for surveillance of injectable contraceptive: Secondary | ICD-10-CM

## 2021-07-14 DIAGNOSIS — Z803 Family history of malignant neoplasm of breast: Secondary | ICD-10-CM

## 2021-07-14 DIAGNOSIS — Z9189 Other specified personal risk factors, not elsewhere classified: Secondary | ICD-10-CM

## 2021-07-14 MED ORDER — MEDROXYPROGESTERONE ACETATE 150 MG/ML IM SUSP
150.0000 mg | Freq: Once | INTRAMUSCULAR | Status: AC
  Administered 2021-07-14: 150 mg via INTRAMUSCULAR

## 2021-07-14 MED ORDER — MEDROXYPROGESTERONE ACETATE 150 MG/ML IM SUSY: 150.0000 mg | PREFILLED_SYRINGE | INTRAMUSCULAR | 3 refills | Status: DC

## 2021-07-14 NOTE — Patient Instructions (Addendum)
I value your feedback and you entrusting us with your care. If you get a [facility] patient survey, I would appreciate you taking the time to let us know about your experience today. Thank you!  [facility]: [phone]      

## 2021-07-14 NOTE — Progress Notes (Signed)
PCP:  Chad Cordial, PA-C   Chief Complaint  Patient presents with   Gynecologic Exam     HPI:      [name] is a 41 y.o. G2P1001 who LMP was No LMP recorded. Patient has had an injection., presents today for her annual examination. Her menses are absent with depo. Dysmenorrhea none. She does not have intermenstrual bleeding.  Sex activity: single partner, contraception - Depo-Provera injections. Wants to continue, has been on it for years. Last Pap: 05/20/20  Results were: no abnormalities /neg HPV DNA  Hx of STDs: none  Last mammogram: February 17, 2017  Results were: normal--routine follow-up in 12 months due to IBIS score.  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 Jamal Collin. No mammo since. Has noticed a tender and hard area near RT breast scar recently. Tenderness started with bra change.  There is a FH of breast cancer in her sister, mat aunt and MGGM. Pt is MyRisk neg 2015. There is a FH of ovarian cancer in her PGM. The patient does do self-breast exams. 2017 IBIS=27%. She is not taking Vit D supp and was slightly deficient in past. No screen breast MRI done  Tobacco use: The patient currently smokes 1/2 packs of cigarettes per day for the past many years. Alcohol use: none No drug use.  Exercise: moderately active  She does get adequate calcium in her diet. Not taking Vit D supp.   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   RT   GERD (gastroesophageal reflux disease)    Herpes simplex    Hyperhidrosis    Increased risk of breast cancer 2017   IBIS=27%   Vaginal Pap smear, abnormal     Past Surgical History:  Procedure Laterality Date   BREAST BIOPSY Right 12/25/13   neg   BREAST LUMPECTOMY Right 05/04/2017    FIBROADENOMA EXCISION BREAST MASS;  Surgeon: Christene Lye, MD;   Location: [facility] ORS;  Service: General;  Laterality: Right;    Family History  Problem Relation Age of Onset   Breast cancer Maternal Grandmother 60       breast   Ovarian cancer Paternal Grandmother        ovarian   Breast cancer Maternal Aunt 70       breast   Breast cancer Sister 53       breast    Social History   Socioeconomic History   Marital status: Married    Spouse name: Not on file   Number of children: Not on file   Years of education: Not on file   Highest education level: Not on file  Occupational History   Not on file  Tobacco Use   Smoking status: Some Days    Packs/day: 1.00    Years: 10.00    Pack years: 10.00    Types: Cigarettes   Smokeless tobacco: Never  Vaping Use   Vaping Use: Never used  Substance and Sexual Activity   Alcohol use: Yes    Comment: WINE OCC   Drug use: No   Sexual activity: Yes    Birth control/protection: Injection  Other Topics Concern   Not on file  Social History Narrative   Not on file   Social Determinants of Health  Financial Resource Strain: Not on file  Food Insecurity: Not on file  Transportation Needs: Not on file  Physical Activity: Not on file  Stress: Not on file  Social Connections: Not on file  Intimate Partner Violence: Not on file    Current Meds  Medication Sig   medroxyPROGESTERone Acetate 150 MG/ML SUSY Inject 1 mL (150 mg total) into the muscle every 3 (three) months.     ROS:  Review of Systems  Constitutional:  Negative for fatigue, fever and unexpected weight change.  Respiratory:  Negative for cough, shortness of breath and wheezing.   Cardiovascular:  Negative for chest pain, palpitations and leg swelling.  Gastrointestinal:  Negative for blood in stool, constipation, diarrhea, nausea and vomiting.  Endocrine: Negative for cold intolerance, heat intolerance and polyuria.  Genitourinary:  Negative for dyspareunia, dysuria, flank pain, frequency, genital sores, hematuria, menstrual  problem, pelvic pain, urgency, vaginal bleeding, vaginal discharge and vaginal pain.  Musculoskeletal:  Negative for back pain, joint swelling and myalgias.  Skin:  Negative for rash.  Neurological:  Negative for dizziness, syncope, light-headedness, numbness and headaches.  Hematological:  Negative for adenopathy.  Psychiatric/Behavioral:  Negative for agitation, confusion, sleep disturbance and suicidal ideas. The patient is not nervous/anxious.     Objective: BP 110/60 (BP Location: Left Arm, Patient Position: Sitting, Cuff Size: Normal)    Resp 16    Wt 168 lb (76.2 kg)    BMI 26.31 kg/m    Physical Exam Constitutional:      Appearance: She is well-developed.  Genitourinary:     Vulva normal.     Right Labia: No rash, tenderness or lesions.    Left Labia: No tenderness, lesions or rash.    No vaginal discharge, erythema or tenderness.      Right Adnexa: not tender and no mass present.    Left Adnexa: not tender and no mass present.    No cervical motion tenderness, friability or polyp.     Uterus is not enlarged or tender.  Breasts:    Right: Mass present. No nipple discharge, skin change or tenderness.     Left: No mass, nipple discharge, skin change or tenderness.  Neck:     Thyroid: No thyromegaly.  Cardiovascular:     Rate and Rhythm: Normal rate and regular rhythm.     Heart sounds: Normal heart sounds. No murmur heard. Pulmonary:     Effort: Pulmonary effort is normal.     Breath sounds: Normal breath sounds.  Chest:    Abdominal:     Palpations: Abdomen is soft.     Tenderness: There is no abdominal tenderness. There is no guarding or rebound.  Musculoskeletal:        General: Normal range of motion.     Cervical back: Normal range of motion.  Lymphadenopathy:     Cervical: No cervical adenopathy.  Neurological:     General: No focal deficit present.     Mental Status: She is alert and oriented to person, place, and time.     Cranial Nerves: No cranial  nerve deficit.  Skin:    General: Skin is warm and dry.  Psychiatric:        Mood and Affect: Mood normal.        Behavior: Behavior normal.        Thought Content: Thought content normal.        Judgment: Judgment normal.  Vitals reviewed.    Assessment/Plan: Encounter for annual routine gynecological examination  Encounter for surveillance of injectable contraceptive - Plan: medroxyPROGESTERone Acetate 150 MG/ML SUSY; depo RF. Cont ca/Add Vit D. Discussed IUD as option.  ° °Encounter for screening mammogram for malignant neoplasm of breast - Plan: US BREAST LTD UNI RIGHT INC AXILLA, US BREAST LTD UNI LEFT INC AXILLA, MM DIAG BREAST TOMO BILATERAL,  ° °Family history of breast cancer - Plan: US BREAST LTD UNI RIGHT INC AXILLA, US BREAST LTD UNI LEFT INC AXILLA, MM DIAG BREAST TOMO BILATERAL, pt is MyRisk neg ° °Increased risk of breast cancer - Plan: US BREAST LTD UNI RIGHT INC AXILLA, US BREAST LTD UNI LEFT INC AXILLA, MM DIAG BREAST TOMO BILATERAL, pt aware of monthly SBE, yearly CBE and mammos, and scr breast MRI. Will call for MRI ref after mammo prn. Add Vit D supp.  ° °Mass of upper inner quadrant of right breast - Plan: US BREAST LTD UNI RIGHT INC AXILLA, US BREAST LTD UNI LEFT INC AXILLA, MM DIAG BREAST TOMO BILATERAL; RT breast near exc scar, question montgomery gland. Will f/u with results.  ° °Meds ordered this encounter  °Medications  ° medroxyPROGESTERone Acetate 150 MG/ML SUSY  °  Sig: Inject 1 mL (150 mg total) into the muscle every 3 (three) months.  °  Dispense:  1 mL  °  Refill:  3  °  Order Specific Question:   Supervising Provider  °  Answer:   HARRIS, ROBERT P [[id number]]  ° medroxyPROGESTERone (DEPO-PROVERA) injection 150 mg  ° ° °      °GYN counsel breast self exam, mammography screening, adequate intake of calcium and vitamin D, diet and exercise ° ° °  F/U ° Return in about 1 year (around 07/14/2022).  ° °Alicia B. Copland, PA-C °07/14/2021 °4:18 PM °

## 2021-07-22 ENCOUNTER — Encounter: Payer: Self-pay | Admitting: Obstetrics and Gynecology

## 2021-07-22 ENCOUNTER — Ambulatory Visit
Admission: RE | Admit: 2021-07-22 | Discharge: 2021-07-22 | Disposition: A | Discharge status: Home / Self Care | Payer: Managed Care, Other (non HMO) | Source: Ambulatory Visit | Attending: Obstetrics and Gynecology | Admitting: Obstetrics and Gynecology

## 2021-07-22 ENCOUNTER — Other Ambulatory Visit: Payer: Self-pay

## 2021-07-22 DIAGNOSIS — Z1231 Encounter for screening mammogram for malignant neoplasm of breast: Secondary | ICD-10-CM | POA: Diagnosis present

## 2021-07-22 DIAGNOSIS — Z9189 Other specified personal risk factors, not elsewhere classified: Secondary | ICD-10-CM | POA: Insufficient documentation

## 2021-07-22 DIAGNOSIS — N6312 Unspecified lump in the right breast, upper inner quadrant: Secondary | ICD-10-CM | POA: Diagnosis present

## 2021-07-22 DIAGNOSIS — Z803 Family history of malignant neoplasm of breast: Secondary | ICD-10-CM

## 2021-07-23 ENCOUNTER — Telehealth: Payer: Self-pay | Admitting: Obstetrics and Gynecology

## 2021-07-23 NOTE — Telephone Encounter (Signed)
Pt called in.  She did have the mammogram that you suggested on 1/12.  They [facility]) suggested that she have a yearly mammogram and MRI with her family history.  What to know if you could send over the order.

## 2021-07-27 ENCOUNTER — Other Ambulatory Visit: Payer: Self-pay | Admitting: Obstetrics and Gynecology

## 2021-07-27 NOTE — Telephone Encounter (Signed)
Called pt, no answer, LVMTRC. 

## 2021-07-27 NOTE — Telephone Encounter (Signed)
Pt and I discussed yearly screen breast MRIs at her annual. Since her mammogram was normal, it is best to stagger with mammos and do it 4-5 months after her mammo so you have more frequent screening. Does this work for her or does she want it now? She also needs to check coverage with her insurance company. It will more than likely go towards her deductible and can be ~$1000-$1400. We can do it in [place] for a self pay rate of $400. Depends on how close she is to meeting her deductible.

## 2021-07-30 NOTE — Telephone Encounter (Signed)
Spoke with pt. She will check with insurance co re: coverage in order to know if we scheds as self pay IMG vs insurance billed imaging. Will do MRI by 7/23. Pt to f/u by 5/23 for MRI order.

## 2021-10-08 ENCOUNTER — Ambulatory Visit (INDEPENDENT_AMBULATORY_CARE_PROVIDER_SITE_OTHER): Payer: Managed Care, Other (non HMO)

## 2021-10-08 DIAGNOSIS — Z3042 Encounter for surveillance of injectable contraceptive: Secondary | ICD-10-CM

## 2021-10-08 MED ORDER — MEDROXYPROGESTERONE ACETATE 150 MG/ML IM SUSP
150.0000 mg | Freq: Once | INTRAMUSCULAR | Status: AC
  Administered 2021-10-08: 150 mg via INTRAMUSCULAR

## 2021-10-08 NOTE — Progress Notes (Signed)
Pt here for depo which was given IM left glut.  Pt tolerated well.  NDC# [phone] ?

## 2022-01-03 ENCOUNTER — Ambulatory Visit (INDEPENDENT_AMBULATORY_CARE_PROVIDER_SITE_OTHER): Payer: Managed Care, Other (non HMO)

## 2022-01-03 VITALS — BP 100/60 | Ht 67.0 in | Wt 170.0 lb

## 2022-01-03 DIAGNOSIS — Z3042 Encounter for surveillance of injectable contraceptive: Secondary | ICD-10-CM

## 2022-01-03 MED ORDER — MEDROXYPROGESTERONE ACETATE 150 MG/ML IM SUSP
150.0000 mg | Freq: Once | INTRAMUSCULAR | Status: AC
  Administered 2022-01-03: 150 mg via INTRAMUSCULAR

## 2022-03-28 ENCOUNTER — Ambulatory Visit: Payer: Managed Care, Other (non HMO)

## 2022-03-28 NOTE — Progress Notes (Deleted)
Date last pap: 05/20/2020. Last Depo-Provera: 01/03/2022. Side Effects if any: None. Serum HCG indicated? N/A. Depo-Provera 150 mg IM given by: Otila Kluver, LPN. Site: Left upper Outer Quadrant Next appointment due 12/4 - 06/27/2022.

## 2022-03-29 ENCOUNTER — Ambulatory Visit: Payer: Managed Care, Other (non HMO)

## 2022-03-31 ENCOUNTER — Ambulatory Visit (INDEPENDENT_AMBULATORY_CARE_PROVIDER_SITE_OTHER): Payer: Managed Care, Other (non HMO)

## 2022-03-31 VITALS — BP 120/80 | Ht 68.0 in | Wt 169.0 lb

## 2022-03-31 DIAGNOSIS — Z3042 Encounter for surveillance of injectable contraceptive: Secondary | ICD-10-CM

## 2022-03-31 MED ORDER — MEDROXYPROGESTERONE ACETATE 150 MG/ML IM SUSP
150.0000 mg | Freq: Once | INTRAMUSCULAR | Status: AC
  Administered 2022-03-31: 150 mg via INTRAMUSCULAR

## 2022-03-31 NOTE — Progress Notes (Signed)
Date last pap: 05/20/20. Last Depo-Provera: 01/03/22. Side Effects if any: n/a. Serum HCG indicated? N/a. Depo-Provera 150 mg IM given by: Quintella Baton. Next appointment due 06/23/22.

## 2022-06-23 ENCOUNTER — Ambulatory Visit: Payer: Managed Care, Other (non HMO)

## 2022-06-23 VITALS — BP 109/65 | HR 83 | Ht 67.0 in | Wt 171.0 lb

## 2022-06-23 DIAGNOSIS — Z3042 Encounter for surveillance of injectable contraceptive: Secondary | ICD-10-CM

## 2022-06-23 MED ORDER — MEDROXYPROGESTERONE ACETATE 150 MG/ML IM SUSP
150.0000 mg | Freq: Once | INTRAMUSCULAR | Status: AC
  Administered 2022-06-23: 150 mg via INTRAMUSCULAR

## 2022-06-23 NOTE — Patient Instructions (Signed)

## 2022-06-23 NOTE — Progress Notes (Signed)
    NURSE VISIT NOTE  Subjective:    Patient ID: Lynn Nguyen, female    DOB: 27-Sep-1980, 41 y.o.   MRN: [id number]  HPI  Patient is a 41 y.o. G29P1001 female who presents for surveillance of Depo Provera Injection.  Date last pap: 05/20/20. Last Depo-Provera: 03/31/2022 Side Effects if any: None Serum HCG indicated? N/A. Depo-Provera 150 mg IM given by: Cristy Folks, CMA Next appointment due: March 1 - March 15        The following portions of the patient's history were reviewed and updated as appropriate: allergies, current medications, past family history, past medical history, past social history, past surgical history, and problem list.  Review of Systems Pertinent items are noted in HPI.   Objective:   Blood pressure 109/65, pulse 83, height '5\' 7"'$  (1.702 m), weight 171 lb (77.6 kg). Body mass index is 26.78 kg/m.  General appearance: alert, cooperative, and no distress   Assessment:   1. Encounter for surveillance of injectable contraceptive      Plan:   - Follow up in 3 months for next depo injection. (March 1 - March 15)   Cristy Folks, [place]

## 2022-09-16 ENCOUNTER — Ambulatory Visit (INDEPENDENT_AMBULATORY_CARE_PROVIDER_SITE_OTHER): Payer: Managed Care, Other (non HMO)

## 2022-09-16 VITALS — Wt 179.3 lb

## 2022-09-16 DIAGNOSIS — Z3042 Encounter for surveillance of injectable contraceptive: Secondary | ICD-10-CM | POA: Diagnosis not present

## 2022-09-16 MED ORDER — MEDROXYPROGESTERONE ACETATE 150 MG/ML IM SUSP
150.0000 mg | Freq: Once | INTRAMUSCULAR | Status: AC
  Administered 2022-09-16: 150 mg via INTRAMUSCULAR

## 2022-09-16 NOTE — Progress Notes (Signed)
    NURSE VISIT NOTE  Subjective:    Patient ID: Lynn Nguyen, female    DOB: [birth year], 42 y.o.   MRN: [id number]  HPI  Patient is a 42 y.o. G74P1001 female who presents for depo provera injection.   Objective:    Wt 179 lb 4.8 oz (81.3 kg)   BMI 28.08 kg/m   Last Annual:07/14/21 . Last pap: 05/20/20. Last Depo-Provera: 06/23/22. Side Effects if any: none. Serum HCG indicated? No . Depo-Provera 150 mg IM given by: Jennings Books, CMA. Site: Right Upper Outer Quandrant    Assessment:   1. Encounter for surveillance of injectable contraceptive      Plan:   Next appointment due between May 23rd and June 6th.    Minette Headland, CMA

## 2022-09-16 NOTE — Patient Instructions (Signed)

## 2022-11-17 IMAGING — MG DIGITAL DIAGNOSTIC BILAT W/ TOMO W/ CAD
6 of 12 series · 6 of 36 positions shown · non-contrast
Comparison: Previous exam(s).

CLINICAL DATA: Strong family history of breast cancer with a 27%
lifetime risk of breast cancer. History of a RIGHT breast excisional
biopsy and multiple fibroadenomas.

EXAM:
DIGITAL DIAGNOSTIC BILATERAL MAMMOGRAM WITH TOMOSYNTHESIS AND CAD;
ULTRASOUND RIGHT BREAST LIMITED
TECHNIQUE: Bilateral digital diagnostic mammography and breast tomosynthesis
was performed. The images were evaluated with computer-aided
detection.; Targeted ultrasound examination of the right breast was
performed

[R CC synth-2D (1 of 2)]
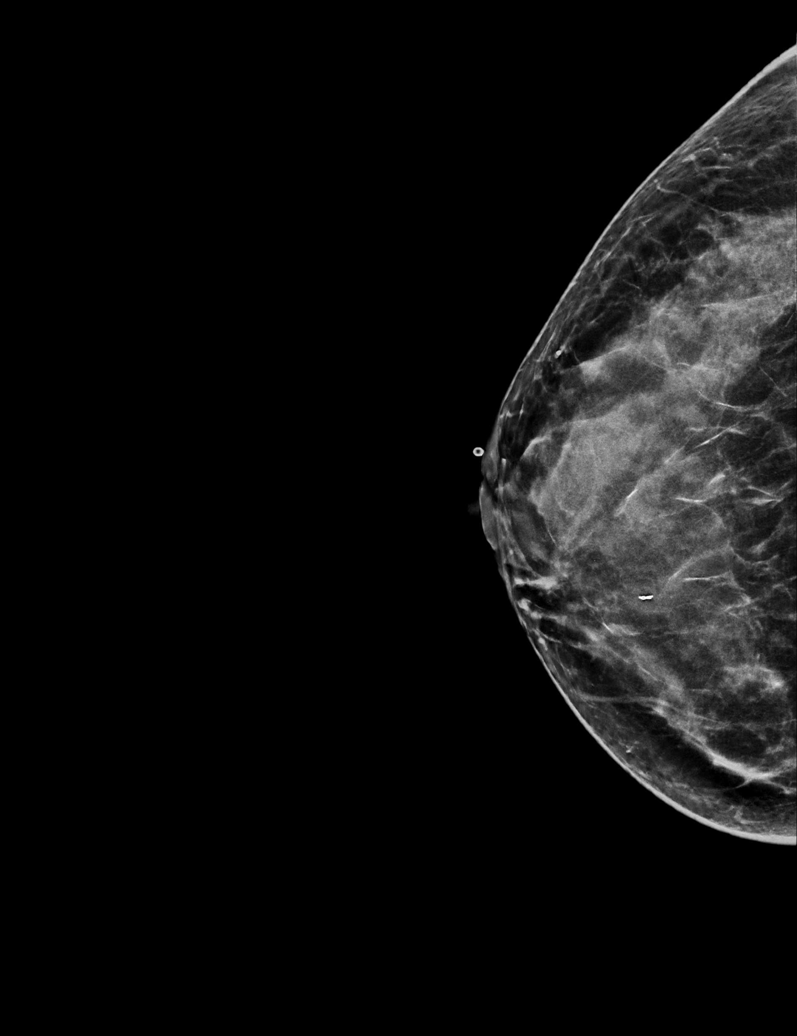

[L MLO synth-2D]
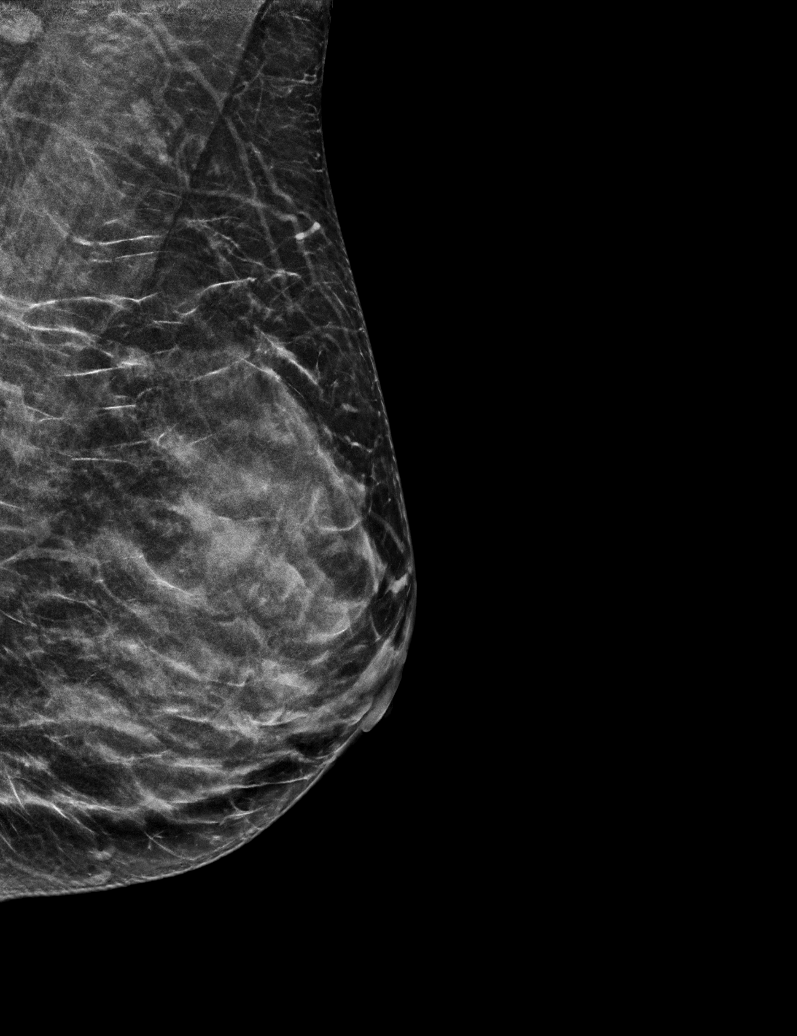

[R CC synth-2D (2 of 2)]
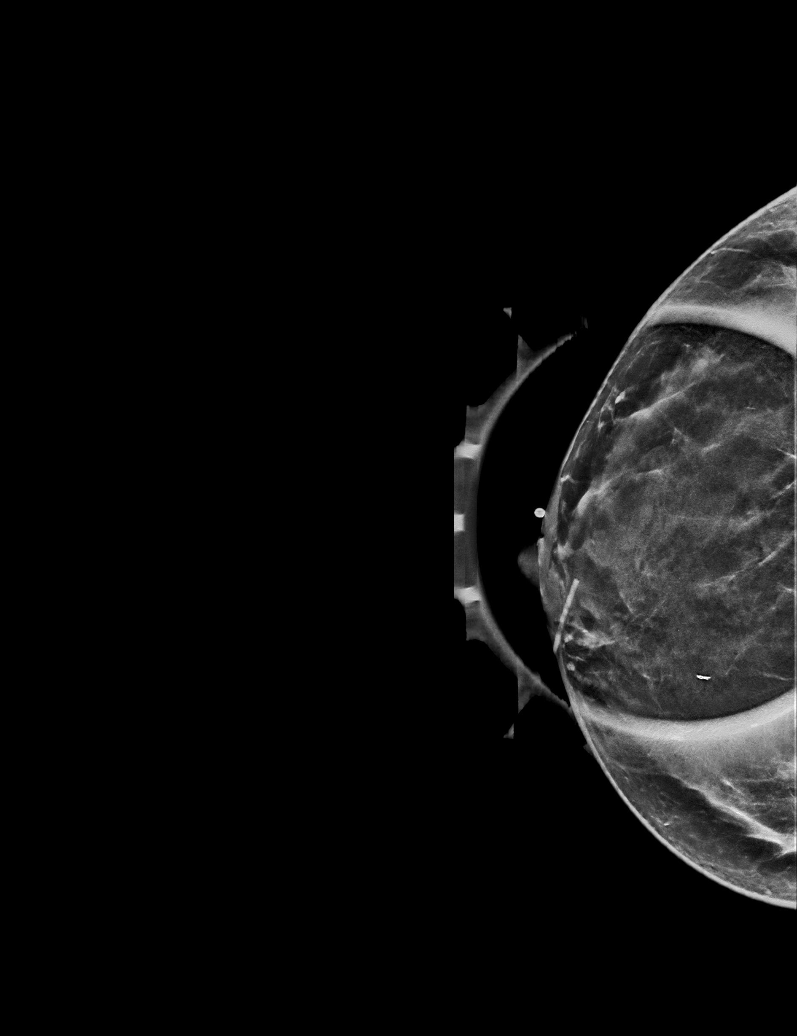

[L CC synth-2D (1 of 2)]
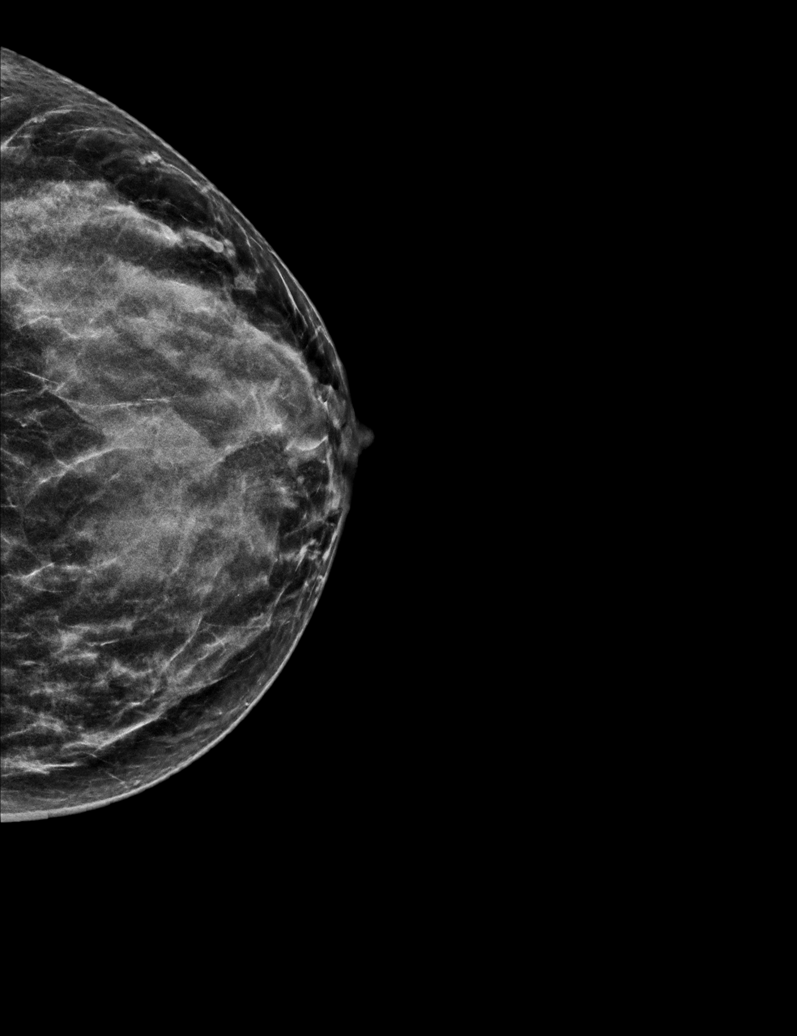

[R MLO synth-2D]
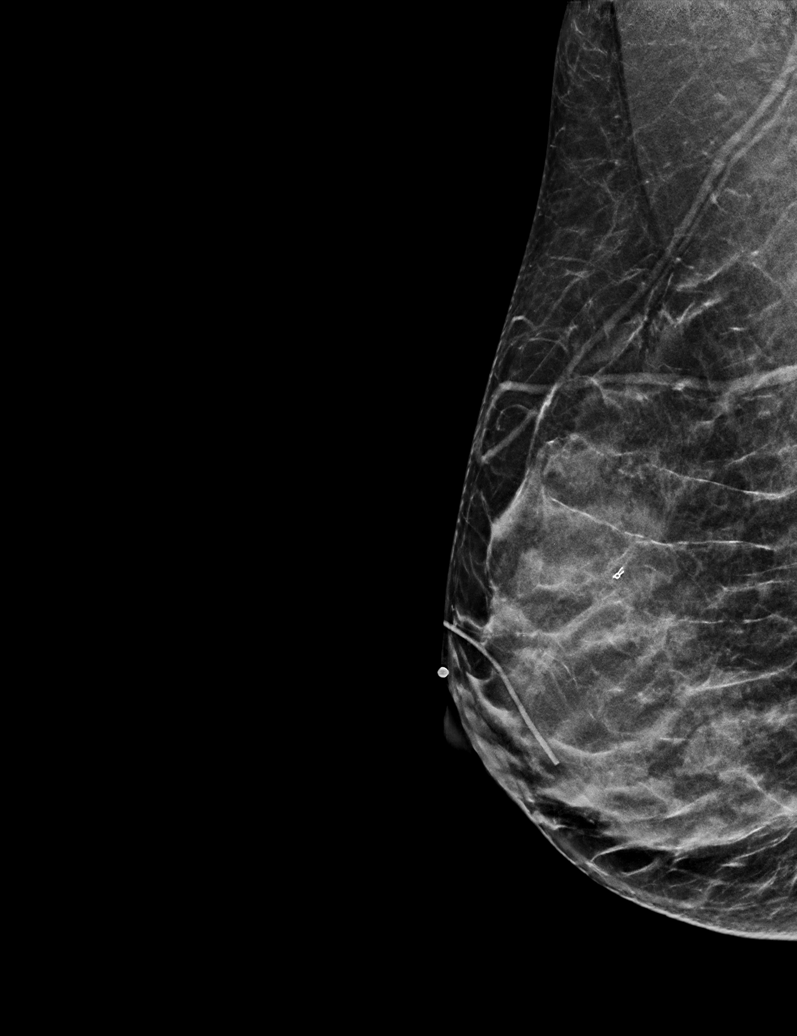

[L CC synth-2D (2 of 2)]
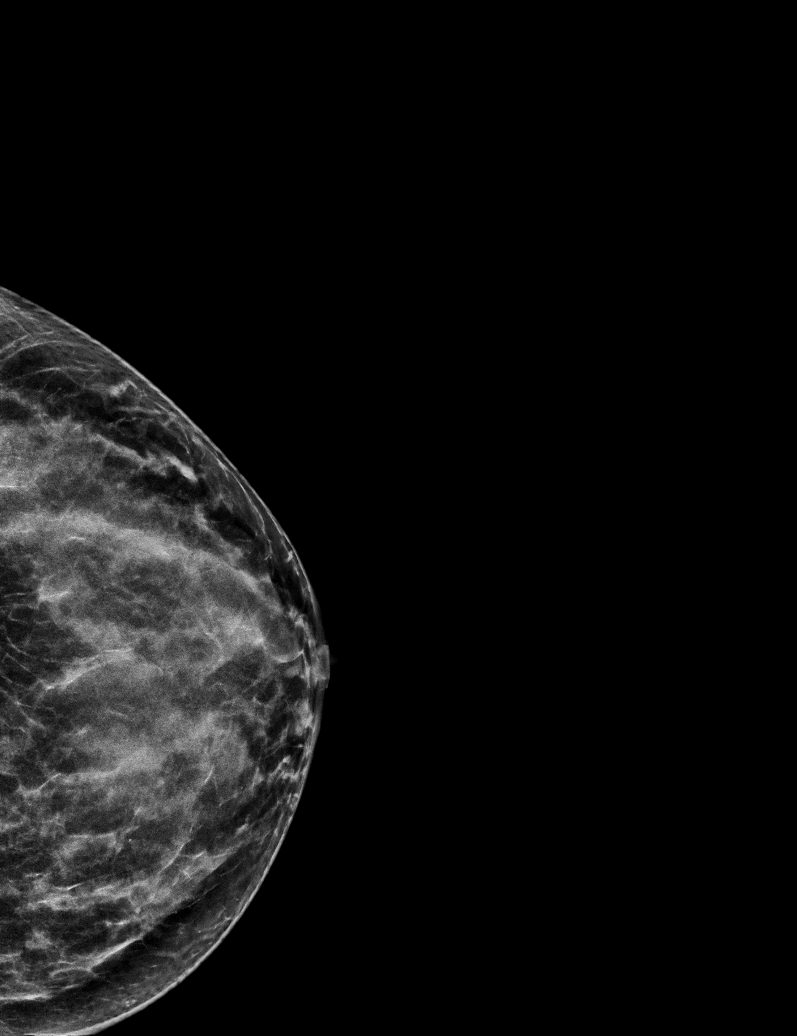

[6 of 36 positions shown; findings below may reference images not displayed]

ACR Breast Density Category d: The breast tissue is extremely dense,
which lowers the sensitivity of mammography.
FINDINGS: Spot compression tomosynthesis views were obtained over the palpable
area of concern in the RIGHT breast. No suspicious mammographic
finding is identified in this area. There is density and
architectural distortion within the RIGHT breast, consistent with
postsurgical changes. no suspicious mass, microcalcification, or
other finding is identified in the RIGHT breast. There are multiple
bilateral round and oval circumscribed masses, most consistent with
benign fibroadenomas or cysts. No suspicious mass, distortion, or
microcalcifications are identified to suggest presence of malignancy
in the LEFT breast.

On physical exam, there is a superficial mobile mass in the RIGHT
upper retroareolar breast in the region of [REDACTED] gland.

Targeted RIGHT breast ultrasound was performed in the palpable area
of concern at the retroareolar breast. No suspicious solid or cystic
mass is identified. There is a intradermal mass which measures 7 x 4
x 7 mm the site of palpable concern at 10 o'clock 1 cm from the
nipple. A tract is seen to the skin surface. This is consistent with
an inflamed [REDACTED] gland.
IMPRESSION: 1. There is a mildly inflamed [REDACTED] gland at the site of
palpable concern in the RIGHT retroareolar breast. Any further
workup of the patient's symptoms should be based on the clinical
assessment. Recommend routine annual screening mammogram in 1 year.
2. No mammographic evidence of malignancy bilaterally.

RECOMMENDATION:
Recommend initiation of high risk screening protocol. The American
Cancer Society recommends annual MRI and mammography in patients
with an estimated lifetime risk of developing breast cancer greater
than 20 - 25%, or who are known or suspected to be positive for the
breast cancer gene.

I have discussed the findings and recommendations with the patient.
If applicable, a reminder letter will be sent to the patient
regarding the next appointment.

BI-RADS CATEGORY  2: Benign.

## 2022-12-09 ENCOUNTER — Ambulatory Visit (INDEPENDENT_AMBULATORY_CARE_PROVIDER_SITE_OTHER): Payer: Managed Care, Other (non HMO)

## 2022-12-09 VITALS — BP 104/71 | HR 80 | Ht 67.0 in | Wt 176.5 lb

## 2022-12-09 DIAGNOSIS — Z3042 Encounter for surveillance of injectable contraceptive: Secondary | ICD-10-CM

## 2022-12-09 MED ORDER — MEDROXYPROGESTERONE ACETATE 150 MG/ML IM SUSP
150.0000 mg | Freq: Once | INTRAMUSCULAR | Status: AC
  Administered 2022-12-09: 150 mg via INTRAMUSCULAR

## 2022-12-09 NOTE — Progress Notes (Signed)
    NURSE VISIT NOTE  Subjective:    Patient ID: RICKIYA MAGLIO, female    DOB: January 04, 1981, 42 y.o.   MRN: [id number]  HPI  Patient is a 42 y.o. G22P1001 female who presents for depo provera injection.   Objective:    BP 104/71   Pulse 80   Ht 5\' 7"  (1.702 m)   Wt 176 lb 8 oz (80.1 kg)   BMI 27.64 kg/m   Last Annual: 07/14/21, next appt scheduled at checkout. Last pap: 05/20/20. Last Depo-Provera: 09/16/22. Side Effects if any: n/a. Serum HCG indicated? No . Depo-Provera 150 mg IM given by: Georgiana Shore, CMA. Site: Left Upper Outer Quandrant    Assessment:   1. Encounter for management and injection of depo-Provera      Plan:   Next appointment due between 02/24/23 and 03/24/23.    Loman Chroman, CMA

## 2023-01-06 ENCOUNTER — Telehealth: Payer: Self-pay

## 2023-01-06 NOTE — Telephone Encounter (Signed)
I contact the patient via phone. I had to leave a generic message asking the patient to call back for scheduling. I have the patient schedule for 7/5 with [facility] for an pelvic exam at 2:35 pm. Awaiting confirmation from the patient.

## 2023-01-06 NOTE — Telephone Encounter (Signed)
The pati.ent has confirmed scheduled appointment

## 2023-01-06 NOTE — Telephone Encounter (Signed)
She can see anyone she normally has seen [facility] she is aware she is on vacation and is ok to see anyone

## 2023-01-13 ENCOUNTER — Ambulatory Visit: Payer: Managed Care, Other (non HMO) | Admitting: Obstetrics and Gynecology

## 2023-02-27 ENCOUNTER — Ambulatory Visit (INDEPENDENT_AMBULATORY_CARE_PROVIDER_SITE_OTHER): Payer: Managed Care, Other (non HMO) | Admitting: Obstetrics and Gynecology

## 2023-02-27 ENCOUNTER — Encounter: Payer: Self-pay | Admitting: Obstetrics and Gynecology

## 2023-02-27 VITALS — BP 117/78 | HR 80 | Resp 16 | Ht 68.0 in | Wt 175.4 lb

## 2023-02-27 DIAGNOSIS — Z1321 Encounter for screening for nutritional disorder: Secondary | ICD-10-CM

## 2023-02-27 DIAGNOSIS — Z01419 Encounter for gynecological examination (general) (routine) without abnormal findings: Secondary | ICD-10-CM | POA: Diagnosis not present

## 2023-02-27 DIAGNOSIS — Z3042 Encounter for surveillance of injectable contraceptive: Secondary | ICD-10-CM

## 2023-02-27 DIAGNOSIS — Z9189 Other specified personal risk factors, not elsewhere classified: Secondary | ICD-10-CM

## 2023-02-27 DIAGNOSIS — Z0000 Encounter for general adult medical examination without abnormal findings: Secondary | ICD-10-CM

## 2023-02-27 DIAGNOSIS — Z131 Encounter for screening for diabetes mellitus: Secondary | ICD-10-CM

## 2023-02-27 DIAGNOSIS — Z803 Family history of malignant neoplasm of breast: Secondary | ICD-10-CM

## 2023-02-27 DIAGNOSIS — Z1151 Encounter for screening for human papillomavirus (HPV): Secondary | ICD-10-CM

## 2023-02-27 DIAGNOSIS — Z130 Encounter for screening for diseases of the blood and blood-forming organs and certain disorders involving the immune mechanism: Secondary | ICD-10-CM

## 2023-02-27 DIAGNOSIS — Z1231 Encounter for screening mammogram for malignant neoplasm of breast: Secondary | ICD-10-CM

## 2023-02-27 DIAGNOSIS — Z124 Encounter for screening for malignant neoplasm of cervix: Secondary | ICD-10-CM

## 2023-02-27 DIAGNOSIS — Z13220 Encounter for screening for lipoid disorders: Secondary | ICD-10-CM

## 2023-02-27 DIAGNOSIS — B001 Herpesviral vesicular dermatitis: Secondary | ICD-10-CM

## 2023-02-27 MED ORDER — MEDROXYPROGESTERONE ACETATE 150 MG/ML IM SUSP
150.0000 mg | INTRAMUSCULAR | Status: DC
  Administered 2023-05-22: 150 mg via INTRAMUSCULAR

## 2023-02-27 MED ORDER — MEDROXYPROGESTERONE ACETATE 150 MG/ML IM SUSP
150.0000 mg | Freq: Once | INTRAMUSCULAR | Status: AC
  Administered 2023-02-27: 150 mg via INTRAMUSCULAR

## 2023-02-27 MED ORDER — VALACYCLOVIR HCL 1 G PO TABS: ORAL_TABLET | ORAL | 0 refills | Status: AC

## 2023-02-27 NOTE — Progress Notes (Signed)
Date last pap: 02/27/2023. Last Depo-Provera: 12/09/2022. Side Effects if any: None. Serum HCG indicated? N/A. Depo-Provera 150 mg IM given by: Santiago Bumpers, CMA. Next appointment due Nov.4 - Nov.18

## 2023-02-27 NOTE — Patient Instructions (Addendum)
[facility] ([place])--[phone]  Medroxyprogesterone Injection (Contraception) What is this medication? MEDROXYPROGESTERONE (me DROX ee proe JES te rone) prevents ovulation and pregnancy. It belongs to a group of medications called contraceptives. This medication is a progestin hormone. This medicine may be used for other purposes; ask your health care provider or pharmacist if you have questions. COMMON BRAND NAME(S): Depo-Provera, Depo-subQ Provera 104 What should I tell my care team before I take this medication? They need to know if you have any of these conditions: Asthma Blood clots Breast cancer or family history of breast cancer Depression Diabetes Eating disorder (anorexia nervosa) Frequently drink alcohol Heart attack High blood pressure HIV infection or AIDS Kidney disease Liver disease Migraine headaches Osteoporosis, weak bones Seizures Stroke Tobacco use Vaginal bleeding An unusual or allergic reaction to medroxyprogesterone, other medications, foods, dyes, or preservatives Pregnant or trying to get pregnant Breast-feeding How should I use this medication? Depo-Provera CI contraceptive injection is given into a muscle. Depo-subQ Provera 104 injection is given under the skin. It is given in a hospital or clinic setting. The injection is usually given during the first 5 days after the start of a menstrual period or 6 weeks after delivery of a baby. A patient package insert for the product will be given with each prescription and refill. Be sure to read this information carefully each time. The sheet may change often. Talk to your care team about the use of this medication in children. Special care may be needed. These injections have been used in female children who have started having menstrual periods. Overdosage: If you think you have taken too much of this medicine contact a poison control center or emergency room at once. NOTE: This  medicine is only for you. Do not share this medicine with others. What if I miss a dose? Keep appointments for follow-up doses. You must get an injection once every 3 months. It is important not to miss your dose. Call your care team if you are unable to keep an appointment. What may interact with this medication? Antibiotics or medications for infections, especially rifampin and griseofulvin Antivirals for HIV or hepatitis Aprepitant Armodafinil Bexarotene Bosentan Medications for seizures, such as carbamazepine, felbamate, oxcarbazepine, phenytoin, phenobarbital, primidone, topiramate Mitotane Modafinil St. John's Wort This list may not describe all possible interactions. Give your health care provider a list of all the medicines, herbs, non-prescription drugs, or dietary supplements you use. Also tell them if you smoke, drink alcohol, or use illegal drugs. Some items may interact with your medicine. What should I watch for while using this medication? This medication does not protect you against HIV infection (AIDS) or other sexually transmitted diseases. Use of this product may cause you to lose calcium from your bones. Loss of calcium may cause weak bones (osteoporosis). Only use this product for more than 2 years if other forms of birth control are not right for you. The longer you use this product for birth control the more likely you will be at risk for weak bones. Ask your care team how you can keep strong bones. You may have a change in bleeding pattern or irregular periods. Many females stop having periods while taking this medication. If you have received your injections on time, your chance of being pregnant is very low. If you think you may be pregnant, see your care team as soon as possible. Tell your care team if you want to get pregnant within the next year. The effect of this medication  may last a long time after you get your last injection. What side effects may I notice from  receiving this medication? Side effects that you should report to your care team as soon as possible: Allergic reactions--skin rash, itching, hives, swelling of the face, lips, tongue, or throat Blood clot--pain, swelling, or warmth in the leg, shortness of breath, chest pain Gallbladder problems--severe stomach pain, nausea, vomiting, fever Increase in blood pressure Liver injury--right upper belly pain, loss of appetite, nausea, light-colored stool, dark yellow or brown urine, yellowing skin or eyes, unusual weakness or fatigue New or worsening migraines or headaches Seizures Stroke--sudden numbness or weakness of the face, arm, or leg, trouble speaking, confusion, trouble walking, loss of balance or coordination, dizziness, severe headache, change in vision Unusual vaginal discharge, itching, or odor Worsening mood, feelings of depression Side effects that usually do not require medical attention (report to your care team if they continue or are bothersome): Breast pain or tenderness Dark patches of the skin on the face or other sun-exposed areas Irregular menstrual cycles or spotting Nausea Weight gain This list may not describe all possible side effects. Call your doctor for medical advice about side effects. You may report side effects to FDA at 1-800-FDA-1088. Where should I keep my medication? This injection is only given by a care team. It will not be stored at home. NOTE: This sheet is a summary. It may not cover all possible information. If you have questions about this medicine, talk to your doctor, pharmacist, or health care provider.  2024 Elsevier/Gold Standard (2021-03-10 00:00:00)

## 2023-02-27 NOTE — Progress Notes (Signed)
PCP:  Pcp, No   Chief Complaint  Patient presents with   Annual Exam     HPI:      [name] is a 42 y.o. G2P1001 who LMP was No LMP recorded. Patient has had an injection., presents today for her annual examination. Her menses are absent with depo. Dysmenorrhea none. She does not have intermenstrual bleeding.  Sex activity: single partner, contraception - Depo-Provera injections. Wants to continue, has been on it for years. Last Pap: 05/20/20  Results were: no abnormalities /neg HPV DNA  Hx of STDs: none  Last mammogram: 07/22/21  Results were: normal--routine follow-up in 12 months.  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 Evette Cristal.  There is a FH of breast cancer in her sister, mat aunt and MGGM. Pt is MyRisk neg 2015. There is a FH of ovarian cancer in her PGM. The patient does do self-breast exams. 2017 IBIS=27%. She is not taking Vit D supp and was slightly deficient in past. No screen breast MRI done  Tobacco use: The patient currently smokes 1/2 packs of cigarettes per day for the past many years. Alcohol use: none No drug use.  Exercise: moderately active  She does get adequate calcium in her diet. Not taking Vit D supp.   Needs health form/labs for LC.  Wants Rx valtrex prn cold sores  Past Medical History:  Diagnosis Date   Abnormal Pap smear of cervix    BRCA negative 2015   MyRisk   Family history of breast cancer    Fibroadenoma 04/2017   RT   GERD (gastroesophageal reflux disease)    Herpes simplex    Hyperhidrosis    Increased risk of breast cancer 2017   IBIS=27%   Vaginal Pap smear, abnormal     Past Surgical History:  Procedure Laterality Date   BREAST BIOPSY Right 12/25/13   neg   BREAST LUMPECTOMY Right 05/04/2017    FIBROADENOMA EXCISION BREAST MASS;  Surgeon: Kieth Brightly, MD;  Location: [facility] ORS;  Service: General;  Laterality: Right;    Family History  Problem Relation Age of Onset   Breast cancer  Maternal Grandmother 60       breast   Ovarian cancer Paternal Grandmother        ovarian   Breast cancer Maternal Aunt 26       breast   Breast cancer Sister 57       breast    Social History   Socioeconomic History   Marital status: Married    Spouse name: Not on file   Number of children: Not on file   Years of education: Not on file   Highest education level: Not on file  Occupational History   Not on file  Tobacco Use   Smoking status: Some Days    Current packs/day: 1.00    Average packs/day: 1 pack/day for 10.0 years (10.0 ttl pk-yrs)    Types: Cigarettes   Smokeless tobacco: Never  Vaping Use   Vaping status: Never Used  Substance and Sexual Activity   Alcohol use: Yes    Comment: WINE OCC   Drug use: No   Sexual activity: Yes    Birth control/protection: Injection  Other Topics Concern   Not on file  Social History Narrative   Not on file   Social Determinants of Health   Financial Resource Strain: Not on file  Food Insecurity: Not on file  Transportation Needs: Not on file  Physical Activity: Sufficiently Active (01/01/2019)   Exercise Vital Sign    Days of Exercise per Week: 5 days    Minutes of Exercise per Session: 30 min  Stress: Not on file  Social Connections: Not on file  Intimate Partner Violence: Not on file    Current Meds  Medication Sig   valACYclovir (VALTREX) 1000 MG tablet Take 2 tabs BID x 1 day prn sx   Current Facility-Administered Medications for the 02/27/23 encounter (Office Visit) with Arushi Partridge, Ilona Sorrel, PA-C  Medication   medroxyPROGESTERone (DEPO-PROVERA) injection 150 mg     ROS:  Review of Systems  Constitutional:  Negative for fatigue, fever and unexpected weight change.  Respiratory:  Negative for cough, shortness of breath and wheezing.   Cardiovascular:  Negative for chest pain, palpitations and leg swelling.  Gastrointestinal:  Negative for blood in stool, constipation, diarrhea, nausea and vomiting.   Endocrine: Negative for cold intolerance, heat intolerance and polyuria.  Genitourinary:  Negative for dyspareunia, dysuria, flank pain, frequency, genital sores, hematuria, menstrual problem, pelvic pain, urgency, vaginal bleeding, vaginal discharge and vaginal pain.  Musculoskeletal:  Negative for back pain, joint swelling and myalgias.  Skin:  Negative for rash.  Neurological:  Negative for dizziness, syncope, light-headedness, numbness and headaches.  Hematological:  Negative for adenopathy.  Psychiatric/Behavioral:  Negative for agitation, confusion, sleep disturbance and suicidal ideas. The patient is not nervous/anxious.      Objective: BP 117/78   Pulse 80   Resp 16   Ht 5\' 8"  (1.727 m)   Wt 175 lb 6.4 oz (79.6 kg)   BMI 26.67 kg/m    Physical Exam Constitutional:      Appearance: She is well-developed.  Genitourinary:     Vulva normal.     Right Labia: No rash, tenderness or lesions.    Left Labia: No tenderness, lesions or rash.    No vaginal discharge, erythema or tenderness.      Right Adnexa: not tender and no mass present.    Left Adnexa: not tender and no mass present.    No cervical friability or polyp.     Uterus is not enlarged or tender.  Breasts:    Right: No mass, nipple discharge, skin change or tenderness.     Left: No mass, nipple discharge, skin change or tenderness.  Neck:     Thyroid: No thyromegaly.  Cardiovascular:     Rate and Rhythm: Normal rate and regular rhythm.     Heart sounds: Normal heart sounds. No murmur heard. Pulmonary:     Effort: Pulmonary effort is normal.     Breath sounds: Normal breath sounds.  Abdominal:     Palpations: Abdomen is soft.     Tenderness: There is no abdominal tenderness. There is no guarding or rebound.  Musculoskeletal:        General: Normal range of motion.     Cervical back: Normal range of motion.  Lymphadenopathy:     Cervical: No cervical adenopathy.  Neurological:     General: No focal  deficit present.     Mental Status: She is alert and oriented to person, place, and time.     Cranial Nerves: No cranial nerve deficit.  Skin:    General: Skin is warm and dry.  Psychiatric:        Mood and Affect: Mood normal.        Behavior: Behavior normal.        Thought Content: Thought content normal.  Judgment: Judgment normal.  Vitals reviewed.     Assessment/Plan: Encounter for annual routine gynecological examination  Cervical cancer screening - Plan: IGP, Aptima HPV   Screening for HPV (human papillomavirus) - Plan: IGP, Aptima HPV  Encounter for surveillance of injectable contraceptive - Plan: medroxyPROGESTERone (DEPO-PROVERA) injection 150 mg; Rx RF. Cont ca/add Vit D.  Encounter for screening mammogram for malignant neoplasm of breast - Plan: MM 3D SCREENING MAMMOGRAM BILATERAL BREAST; pt to schedule mammo  Family history of breast cancer - Plan: MM 3D SCREENING MAMMOGRAM BILATERAL BREAST; pt is MyRIsk neg  Increased risk of breast cancer - Plan: MM 3D SCREENING MAMMOGRAM BILATERAL BREAST; aware of recommendations of monthly SBE, yearly CBE and mammos, as well as scr breast MRI. Will call for MRI ref prn.   Encounter for management and injection of depo-Provera - Plan: medroxyPROGESTERone (DEPO-PROVERA) injection 150 mg, medroxyPROGESTERone (DEPO-PROVERA) injection 150 mg  Cold sore - Plan: valACYclovir (VALTREX) 1000 MG tablet; Rx eRxd.   Blood tests for routine general physical examination - Plan: Comprehensive metabolic panel, Hemoglobin A1c, Lipid panel, VITAMIN D 25 Hydroxy (Vit-D Deficiency, Fractures)  Screening cholesterol level - Plan: Lipid panel  Encounter for vitamin deficiency screening - Plan: VITAMIN D 25 Hydroxy (Vit-D Deficiency, Fractures)  Screening for deficiency anemia  Screening for diabetes mellitus - Plan: Hemoglobin A1c  Will complete lab form and fax results.    Meds ordered this encounter  Medications    medroxyPROGESTERone (DEPO-PROVERA) injection 150 mg   valACYclovir (VALTREX) 1000 MG tablet    Sig: Take 2 tabs BID x 1 day prn sx    Dispense:  30 tablet    Refill:  0    Order Specific Question:   Supervising Provider    Answer:   Hildred Laser [[id number]]   medroxyPROGESTERone (DEPO-PROVERA) injection 150 mg          GYN counsel breast self exam, mammography screening, adequate intake of calcium and vitamin D, diet and exercise     F/U  Return in about 3 months (around 05/30/2023) for Depo Injection.   Tavon Corriher B. Jaclene Bartelt, PA-C 02/27/2023 4:32 PM

## 2023-02-28 LAB — COMPREHENSIVE METABOLIC PANEL
ALT: 12 IU/L (ref 0–32)
AST: 10 IU/L (ref 0–40)
Albumin: 4.6 g/dL (ref 3.9–4.9)
Alkaline Phosphatase: 71 IU/L (ref 44–121)
BUN/Creatinine Ratio: 10 (ref 9–23)
BUN: 8 mg/dL (ref 6–24)
Bilirubin Total: 0.3 mg/dL (ref 0.0–1.2)
CO2: 20 mmol/L (ref 20–29)
Calcium: 9.2 mg/dL (ref 8.7–10.2)
Chloride: 105 mmol/L (ref 96–106)
Creatinine, Ser: 0.8 mg/dL (ref 0.57–1.00)
Globulin, Total: 2.3 g/dL (ref 1.5–4.5)
Glucose: 94 mg/dL (ref 70–99)
Potassium: 4.1 mmol/L (ref 3.5–5.2)
Sodium: 139 mmol/L (ref 134–144)
Total Protein: 6.9 g/dL (ref 6.0–8.5)
eGFR: 95 mL/min/{1.73_m2} (ref >59)

## 2023-02-28 LAB — LIPID PANEL
Chol/HDL Ratio: 3.6 ratio (ref 0.0–4.4)
Cholesterol, Total: 200 mg/dL — ABNORMAL HIGH (ref 100–199)
HDL: 56 mg/dL (ref >39)
LDL Chol Calc (NIH): 108 mg/dL — ABNORMAL HIGH (ref 0–99)
Triglycerides: 211 mg/dL — ABNORMAL HIGH (ref 0–149)
VLDL Cholesterol Cal: 36 mg/dL (ref 5–40)

## 2023-02-28 LAB — HEMOGLOBIN A1C
Est. average glucose Bld gHb Est-mCnc: 111 mg/dL
Hgb A1c MFr Bld: 5.5 % (ref 4.8–5.6)

## 2023-02-28 LAB — VITAMIN D 25 HYDROXY (VIT D DEFICIENCY, FRACTURES): Vit D, 25-Hydroxy: 35.3 ng/mL (ref 30.0–100.0)

## 2023-03-06 LAB — IGP, APTIMA HPV: HPV Aptima: NEGATIVE

## 2023-03-20 ENCOUNTER — Telehealth: Payer: Self-pay | Admitting: Obstetrics and Gynecology

## 2023-03-20 NOTE — Telephone Encounter (Signed)
The patient is returning missed call. Please advise?

## 2023-03-20 NOTE — Telephone Encounter (Signed)
Taken care of

## 2023-04-11 ENCOUNTER — Other Ambulatory Visit: Payer: Self-pay | Admitting: Obstetrics and Gynecology

## 2023-04-11 DIAGNOSIS — B001 Herpesviral vesicular dermatitis: Secondary | ICD-10-CM

## 2023-05-22 ENCOUNTER — Ambulatory Visit (INDEPENDENT_AMBULATORY_CARE_PROVIDER_SITE_OTHER): Payer: Managed Care, Other (non HMO)

## 2023-05-22 VITALS — BP 118/78 | HR 91 | Ht 68.0 in | Wt 184.2 lb

## 2023-05-22 DIAGNOSIS — Z3042 Encounter for surveillance of injectable contraceptive: Secondary | ICD-10-CM

## 2023-05-22 NOTE — Patient Instructions (Signed)

## 2023-05-22 NOTE — Progress Notes (Signed)
    NURSE VISIT NOTE  Subjective:    Patient ID: CHRYSTA IZZARD, female    DOB: Apr 03, 1981, 42 y.o.   MRN: [id number]  HPI  Patient is a 42 y.o. G33P1001 female who presents for depo provera injection.   Objective:    There were no vitals taken for this visit.  Last Annual: 11/27/2022. Last pap: 02/27/23. Last Depo-Provera: 02/27/23. Side Effects if any: none. Serum HCG indicated? No . Depo-Provera 150 mg IM given by: Rocco Serene, LPN. Site: Right Upper Outer Quandrant  Lab Review  @THIS  VISIT ONLY@  Assessment:   1. Encounter for surveillance of injectable contraceptive      Plan:   Next appointment due between 08/07/23 and 08/21/23.    Rocco Serene, LPN

## 2023-07-25 ENCOUNTER — Ambulatory Visit
Admission: RE | Admit: 2023-07-25 | Discharge: 2023-07-25 | Disposition: A | Discharge status: Home / Self Care | Payer: Managed Care, Other (non HMO) | Source: Ambulatory Visit | Attending: Obstetrics and Gynecology | Admitting: Obstetrics and Gynecology

## 2023-07-25 DIAGNOSIS — Z803 Family history of malignant neoplasm of breast: Secondary | ICD-10-CM | POA: Insufficient documentation

## 2023-07-25 DIAGNOSIS — Z9189 Other specified personal risk factors, not elsewhere classified: Secondary | ICD-10-CM | POA: Insufficient documentation

## 2023-07-25 DIAGNOSIS — Z1231 Encounter for screening mammogram for malignant neoplasm of breast: Secondary | ICD-10-CM | POA: Diagnosis present

## 2023-07-27 ENCOUNTER — Encounter: Payer: Self-pay | Admitting: Obstetrics and Gynecology

## 2023-07-27 ENCOUNTER — Other Ambulatory Visit: Payer: Self-pay | Admitting: Obstetrics and Gynecology

## 2023-07-27 DIAGNOSIS — R928 Other abnormal and inconclusive findings on diagnostic imaging of breast: Secondary | ICD-10-CM

## 2023-08-01 ENCOUNTER — Ambulatory Visit
Admission: RE | Admit: 2023-08-01 | Discharge: 2023-08-01 | Disposition: A | Discharge status: Home / Self Care | Payer: Managed Care, Other (non HMO) | Source: Ambulatory Visit | Attending: Obstetrics and Gynecology | Admitting: Obstetrics and Gynecology

## 2023-08-01 DIAGNOSIS — R928 Other abnormal and inconclusive findings on diagnostic imaging of breast: Secondary | ICD-10-CM

## 2023-08-06 ENCOUNTER — Encounter: Payer: Self-pay | Admitting: Obstetrics and Gynecology

## 2023-08-15 ENCOUNTER — Ambulatory Visit: Payer: Managed Care, Other (non HMO)

## 2023-08-17 ENCOUNTER — Ambulatory Visit: Payer: Managed Care, Other (non HMO)

## 2023-08-17 VITALS — BP 131/79 | HR 87 | Wt 180.3 lb

## 2023-08-17 DIAGNOSIS — Z3042 Encounter for surveillance of injectable contraceptive: Secondary | ICD-10-CM

## 2023-08-17 MED ORDER — MEDROXYPROGESTERONE ACETATE 150 MG/ML IM SUSY
150.0000 mg | PREFILLED_SYRINGE | Freq: Once | INTRAMUSCULAR | Status: AC
  Administered 2023-08-18: 150 mg via INTRAMUSCULAR

## 2023-08-17 NOTE — Progress Notes (Addendum)
    NURSE VISIT NOTE  Subjective:    Patient ID: Lynn Nguyen, female    DOB: 1981-04-07, 43 y.o.   MRN: [id number]  HPI  Patient is a 43 y.o. G28P1001 female who presents for depo provera  injection.   Objective:    BP 131/79 (BP Location: Left Arm, Patient Position: Sitting, Cuff Size: Normal)   Pulse 87   Wt 180 lb 4.8 oz (81.8 kg)   BMI 27.41 kg/m   Last Annual: 02/27/2023. Last pap: 02/27/2023. Last Depo-Provera : 05/22/2023. Side Effects if any: none. Serum HCG indicated? No . Depo-Provera  150 mg IM given by: Burnard Ro, CMA. Site: Left Upper Outer Quandrant    Assessment:   1. Encounter for surveillance of injectable contraceptive      Plan:   Next appointment due between April 24th and May 8th.    Burnard LITTIE Ro, CMA

## 2023-08-18 DIAGNOSIS — Z3042 Encounter for surveillance of injectable contraceptive: Secondary | ICD-10-CM | POA: Diagnosis not present

## 2023-09-18 ENCOUNTER — Ambulatory Visit (INDEPENDENT_AMBULATORY_CARE_PROVIDER_SITE_OTHER): Payer: Managed Care, Other (non HMO) | Admitting: Obstetrics and Gynecology

## 2023-09-18 ENCOUNTER — Encounter: Payer: Self-pay | Admitting: Obstetrics and Gynecology

## 2023-09-18 VITALS — BP 113/67 | HR 150 | Ht 69.0 in | Wt 185.0 lb

## 2023-09-18 DIAGNOSIS — R5382 Chronic fatigue, unspecified: Secondary | ICD-10-CM

## 2023-09-18 DIAGNOSIS — L918 Other hypertrophic disorders of the skin: Secondary | ICD-10-CM

## 2023-09-18 DIAGNOSIS — R0683 Snoring: Secondary | ICD-10-CM

## 2023-09-18 DIAGNOSIS — L989 Disorder of the skin and subcutaneous tissue, unspecified: Secondary | ICD-10-CM

## 2023-09-18 NOTE — Progress Notes (Signed)
 [name] is a 43 y.o. G2P1001 whose LMP was No LMP recorded. Patient has had an injection., presents today for several skin issues. Pt has skin tag LT labia for many years and it is bothersome to her. No pain/size increase. Would like it removed.  She developed raised nodular lesion on RT forearm 4/24. She initially thought it was a bite but it hasn't resolved. No pain/itching/change in size. No other similar lesions. Pt has significant fatigue, sleeping 6 hrs nightly. Will wake after 6 hrs sleep no matter when she goes to bed. Husband says she is snores loudly and all night long, but no apneic episodes. Strong FH sleep apnea.    Physical Exam Constitutional:      Appearance: She is well-developed.  Genitourinary:     Left Labia: lesions.      Pulmonary:     Effort: Pulmonary effort is normal.  Musculoskeletal:        General: Normal range of motion.     Cervical back: Normal range of motion.  Neurological:     Mental Status: She is alert and oriented to person, place, and time.  Skin:    General: Skin is warm.       Psychiatric:        Behavior: Behavior normal.        Thought Content: Thought content normal.      Assessment/Plan: Skin tag--RTO with Dr. Logan Bores for excision.  Arm lesion--pt to call derm for eval.   Snoring - Plan: Ambulatory referral to Sleep Studies; referral placed  Chronic fatigue - Plan: Ambulatory referral to Sleep Studies   Return in about 1 week (around 09/25/2023) for with Dr. Clayburn Pert for labial skin tag removal in cautery room per ABC.  Lynn Campoy B. Lynn Monts, PA-C 09/18/2023 4:14 PM   NOT CHARGING PT BC SHE NEEDS TO SEE DERM AND SLEEP STUDY REF

## 2023-11-07 ENCOUNTER — Encounter: Payer: Self-pay | Admitting: Obstetrics and Gynecology

## 2023-11-07 ENCOUNTER — Ambulatory Visit (INDEPENDENT_AMBULATORY_CARE_PROVIDER_SITE_OTHER): Admitting: Obstetrics and Gynecology

## 2023-11-07 VITALS — BP 115/70 | HR 84 | Ht 69.0 in | Wt 187.2 lb

## 2023-11-07 DIAGNOSIS — N9089 Other specified noninflammatory disorders of vulva and perineum: Secondary | ICD-10-CM | POA: Diagnosis not present

## 2023-11-07 DIAGNOSIS — Z3042 Encounter for surveillance of injectable contraceptive: Secondary | ICD-10-CM

## 2023-11-07 DIAGNOSIS — L918 Other hypertrophic disorders of the skin: Secondary | ICD-10-CM

## 2023-11-07 MED ORDER — MEDROXYPROGESTERONE ACETATE 150 MG/ML IM SUSP
150.0000 mg | Freq: Once | INTRAMUSCULAR | Status: AC
  Administered 2023-11-07: 150 mg via INTRAMUSCULAR

## 2023-11-07 NOTE — Progress Notes (Signed)
 Patient presents today for skin tag removal.      Last Annual: 02/27/23. Last pap: 02/27/23. Last Depo-Provera : 08/17/23. Side Effects if any: n/a. Serum HCG indicated? No . Depo-Provera  150 mg IM given by: Woody Heading, CMA. Site: Right Upper Outer Quandrant   Next appointment due between 01/23/24 and 02/06/24.    Vale Garrison, CMA

## 2023-11-07 NOTE — Progress Notes (Signed)
 HPI:      [name] is a 43 y.o. G2P1001 who LMP was No LMP recorded. Patient has had an injection.  Subjective:   She presents today because she has a vulvar skin tag that has been present for many years.  She would like it removed.    Hx: The following portions of the patient's history were reviewed and updated as appropriate:             She  has a past medical history of Abnormal Pap smear of cervix, BRCA negative (2015), Family history of breast cancer, Fibroadenoma (04/2017), GERD (gastroesophageal reflux disease), Herpes simplex, Hyperhidrosis, Increased risk of breast cancer (2017), and Vaginal Pap smear, abnormal. She does not have any pertinent problems on file. She  has a past surgical history that includes Breast biopsy (Right, 12/25/13) and Breast lumpectomy (Right, 05/04/2017). Her family history includes Breast cancer (age of onset: 35) in her sister; Breast cancer (age of onset: 54) in her maternal grandmother; Breast cancer (age of onset: 75) in her maternal aunt; Ovarian cancer in her paternal grandmother. She  reports that she has been smoking cigarettes. She has a 10 pack-year smoking history. She has never used smokeless tobacco. She reports current alcohol use. She reports that she does not use drugs. She has a current medication list which includes the following prescription(s): valacyclovir , and the following Facility-Administered Medications: bupivacaine  (pf), lidocaine -epinephrine , and medroxyprogesterone . She has no known allergies.       Review of Systems:  Review of Systems  Constitutional: Denied constitutional symptoms, night sweats, recent illness, fatigue, fever, insomnia and weight loss.  Eyes: Denied eye symptoms, eye pain, photophobia, vision change and visual disturbance.  Ears/Nose/Throat/Neck: Denied ear, nose, throat or neck symptoms, hearing loss, nasal discharge, sinus congestion and sore throat.  Cardiovascular: Denied cardiovascular  symptoms, arrhythmia, chest pain/pressure, edema, exercise intolerance, orthopnea and palpitations.  Respiratory: Denied pulmonary symptoms, asthma, pleuritic pain, productive sputum, cough, dyspnea and wheezing.  Gastrointestinal: Denied, gastro-esophageal reflux, melena, nausea and vomiting.  Genitourinary: See HPI for additional information.  Musculoskeletal: Denied musculoskeletal symptoms, stiffness, swelling, muscle weakness and myalgia.  Dermatologic: Denied dermatology symptoms, rash and scar.  Neurologic: Denied neurology symptoms, dizziness, headache, neck pain and syncope.  Psychiatric: Denied psychiatric symptoms, anxiety and depression.  Endocrine: Denied endocrine symptoms including hot flashes and night sweats.   Meds:   Current Outpatient Medications on File Prior to Visit  Medication Sig Dispense Refill   valACYclovir  (VALTREX ) 1000 MG tablet Take 2 tabs BID x 1 day prn sx 30 tablet 0   Current Facility-Administered Medications on File Prior to Visit  Medication Dose Route Frequency Provider Last Rate Last Admin   bupivacaine  (PF) (MARCAINE ) 0.25 % injection    Anesthesia Intra-op Racheal Buddle, CRNA   4 mL at 11/21/14 1218   lidocaine -EPINEPHrine  1.5 %-1:200000 injection    Anesthesia Intra-op Racheal Buddle, CRNA   3 mL at 11/21/14 1210   medroxyPROGESTERone  (DEPO-PROVERA ) injection 150 mg  150 mg Intramuscular Q90 days    150 mg at 05/22/23 1636      Objective:     Vitals:   11/07/23 1444  BP: 115/70  Pulse: 84   Filed Weights   11/07/23 1444  Weight: 187 lb 3.2 oz (84.9 kg)              Area cleaned with Betadine base of lesion injected with lidocaine  and epinephrine .  Excision performed with a scalpel 2 sutures of 4-0 Vicryl placed  subcuticularly.  Hemostasis excellent.  Patient tolerated the procedure well.          Assessment:    G2P1001 Patient Active Problem List   Diagnosis Date Noted   Encounter for surveillance of injectable contraceptive  05/22/2023   Family history of breast cancer 12/31/2018   Increased risk of breast cancer 05/24/2017   Intrauterine normal pregnancy 11/20/2014   Breast mass 12/25/2013     1. Skin tag   2. Encounter for surveillance of injectable contraceptive        Plan:            1.  Wound care discussed.  2.  May resume normal activities tomorrow.  3.  Gentle cleansing in the shower. Orders No orders of the defined types were placed in this encounter.    Meds ordered this encounter  Medications   medroxyPROGESTERone  (DEPO-PROVERA ) injection 150 mg      F/U  No follow-ups on file.  Delice Felt, M.D. 11/07/2023 3:49 PM

## 2023-11-09 ENCOUNTER — Ambulatory Visit: Payer: Managed Care, Other (non HMO)

## 2023-11-14 LAB — ANATOMIC PATHOLOGY REPORT

## 2024-02-02 ENCOUNTER — Ambulatory Visit

## 2024-02-02 VITALS — BP 104/72 | HR 84 | Ht 68.0 in | Wt 191.9 lb

## 2024-02-02 DIAGNOSIS — Z3042 Encounter for surveillance of injectable contraceptive: Secondary | ICD-10-CM

## 2024-02-02 MED ORDER — MEDROXYPROGESTERONE ACETATE 150 MG/ML IM SUSP
150.0000 mg | Freq: Once | INTRAMUSCULAR | Status: AC
  Administered 2024-02-02: 150 mg via INTRAMUSCULAR

## 2024-02-02 NOTE — Progress Notes (Signed)
    NURSE VISIT NOTE  Subjective:    Patient ID: Lynn Nguyen, female    DOB: January 22, 1981, 43 y.o.   MRN: [id number]  HPI  Patient is a 43 y.o. G64P1001 female who presents for depo provera  injection.   Objective:    BP 104/72   Pulse 84   Ht 5' 8 (1.727 m)   Wt 191 lb 14.4 oz (87 kg)   BMI 29.18 kg/m   Last Annual: 02/27/23. Last pap: 02/27/23. Last Depo-Provera : 4/29. Side Effects if any: n/a. Serum HCG indicated? No. Depo-Provera  150 mg IM given by: Waddell Maxim, CMA. Site: Left Upper Outer Quandrant   Assessment:   1. Encounter for Depo-Provera  contraception      Plan:   Next appointment due between 04/19/24 and 05/03/24.    Waddell JONELLE Maxim, CMA

## 2024-02-02 NOTE — Patient Instructions (Signed)
 Birth Control Shot: What to Expect A birth control shot prevents pregnancy. A birth control shot is put in (injected) into the skin or a muscle. The shot contains the hormone progestin. Hormones are chemicals that affect how the body works. Progestin prevents pregnancy because it: Stops the ovaries from releasing eggs. Makes cervical mucus thicker. This prevents sperm from getting into the cervix. The cervix is the lowest part of the uterus. Thins the lining of the uterus to prevent a fertilized egg from attaching to the uterus. Tell a health care provider about: Any allergies you have. All medicines you take. These include vitamins, herbs, eye drops, and creams. Any bleeding problems you have. Any medical problems you have. Whether you're pregnant or may be pregnant. What are the risks? Your health care provider will talk with you about risks. These may include: Mood changes or depression. Your bones becoming thinner or weaker. This is called loss of bone density. This can happen if you get the shots for a long period of time. This can cause your bones to break. Blood clots. These are rare. A higher risk of an egg being fertilized outside your uterus, called an ectopic pregnancy.This is rare. What happens before? Your provider may do a physical exam. You may have a test to make sure you aren't pregnant. What happens during a birth control shot?  The area where the shot will be given will be cleaned. A needle will be put into a muscle in your upper arm or butt, or into the skin of your thigh or belly. The needle will be put on a syringe with the medicine in it. The medicine will be pushed through the syringe into your body. A small bandage may be put over the place where the shot was given. What happens after? After the shot, it's common to have: Soreness around the place where the shot was given for a couple of days. Spotting or bleeding between periods. Weight gain. Tender  breasts. Headaches. Belly pain. Ask your provider if you need to use an added method of birth control, such as a condom, sponge, or spermicide. If the first shot is given 1-7 days after the start of your last period, you won't need to use an added method of birth control. If the first shot is given at any other time during your menstrual cycle, you'll need to use an added method of birth control for 7 days after you get the shot. Follow these instructions at home: General instructions Take your medicines only as told. Do not rub or massage the place where the shot was given. Track your periods. This will help you know if they become irregular. Always use a condom to protect against sexually transmitted infections (STIs). Make an appointment in time for your next shot and mark it on your calendar. You must get a shot every 3 months (12-13 weeks) to prevent pregnancy. Lifestyle Do not smoke, vape, or use nicotine or tobacco. Eat foods that are high in calcium and vitamin D, such as milk, cheese, and salmon. Calcium helps keep your bones strong and may help with any loss in bone density caused by the birth control shot. Ask your provider if you should take supplements. Contact a health care provider if you: Have discharge or bleeding from your vagina that isn't normal. Miss a period or think you might be pregnant. Have mood changes or depression. Feel dizzy or light-headed. Have leg pain. Get help right away if you: Have chest pain  or cough up blood. Have trouble breathing. Have a really bad headache that doesn't go away. Have numbness in any part of your body. Have really bad pain in your belly. Have slurred speech or vision problems. These symptoms may be an emergency. Call 911 right away. Do not wait to see if the symptoms will go away. Do not drive yourself to the hospital. This information is not intended to replace advice given to you by your health care provider. Make sure you  discuss any questions you have with your health care provider. Document Revised: 03/02/2023 Document Reviewed: 03/02/2023 Elsevier Patient Education  2024 [employer].

## 2024-03-03 NOTE — Progress Notes (Unsigned)
 PCP:  Pcp, No   No chief complaint on file.    HPI:      [name] is a 43 y.o. G2P1001 who LMP was No LMP recorded. Patient has had an injection., presents today for her annual examination. Her menses are absent with depo. Dysmenorrhea none. She does not have intermenstrual bleeding.  Sex activity: single partner, contraception - Depo-Provera  injections. Wants to continue, has been on it for years. Last Pap: 02/27/23 Results were ASCUS/neg HPV DNA; repeat due in 1 yr.  Hx of STDs: none  Last mammogram: 08/01/23  Results were: normal after addl views--routine follow-up in 12 months.  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 Sankar.  There is a FH of breast cancer in her sister, mat aunt and MGGM. Pt is MyRisk neg 2015. There is a FH of ovarian cancer in her PGM. The patient does do self-breast exams. 2017 IBIS=27%. She is not taking Vit D supp and was slightly deficient in past. No screen breast MRI done  Tobacco use: The patient currently smokes 1/2 packs of cigarettes per day for the past many years. Alcohol use: none No drug use.  Exercise: moderately active  She does get adequate calcium in her diet. Not taking Vit D supp.   Needs health form/labs for LC.  Wants Rx valtrex  prn cold sores  Past Medical History:  Diagnosis Date   Abnormal Pap smear of cervix    BRCA negative 2015   MyRisk   Family history of breast cancer    Fibroadenoma 04/2017   RT   GERD (gastroesophageal reflux disease)    Herpes simplex    Hyperhidrosis    Increased risk of breast cancer 2017   IBIS=27%   Vaginal Pap smear, abnormal     Past Surgical History:  Procedure Laterality Date   BREAST BIOPSY Right 12/25/13   neg   BREAST LUMPECTOMY Right 05/04/2017    FIBROADENOMA EXCISION BREAST MASS;  Surgeon: Dellie Louanne MATSU, MD;  Location: [facility];  Service: General;  Laterality: Right;    Family History  Problem Relation Age of Onset   Breast cancer Maternal  Grandmother 60       breast   Ovarian cancer Paternal Grandmother        ovarian   Breast cancer Maternal Aunt 38       breast   Breast cancer Sister 50       breast    Social History   Socioeconomic History   Marital status: Married    Spouse name: Not on file   Number of children: Not on file   Years of education: Not on file   Highest education level: Not on file  Occupational History   Not on file  Tobacco Use   Smoking status: Some Days    Current packs/day: 1.00    Average packs/day: 1 pack/day for 10.0 years (10.0 ttl pk-yrs)    Types: Cigarettes   Smokeless tobacco: Never  Vaping Use   Vaping status: Never Used  Substance and Sexual Activity   Alcohol use: Yes    Comment: WINE OCC   Drug use: No   Sexual activity: Yes    Birth control/protection: Injection  Other Topics Concern   Not on file  Social History Narrative   Not on file   Social Drivers of Health   Financial Resource Strain: Not on file  Food Insecurity: Not on file  Transportation Needs: Not on file  Physical Activity: Sufficiently Active (01/01/2019)   Exercise Vital Sign    Days of Exercise per Week: 5 days    Minutes of Exercise per Session: 30 min  Stress: Not on file  Social Connections: Not on file  Intimate Partner Violence: Not on file    No outpatient medications have been marked as taking for the 03/05/24 encounter (Appointment) with Finnis Colee B, PA-C.   Current Facility-Administered Medications for the 03/05/24 encounter (Appointment) with Hisako Bugh B, PA-C  Medication   medroxyPROGESTERone  (DEPO-PROVERA ) injection 150 mg     ROS:  Review of Systems  Constitutional:  Negative for fatigue, fever and unexpected weight change.  Respiratory:  Negative for cough, shortness of breath and wheezing.   Cardiovascular:  Negative for chest pain, palpitations and leg swelling.  Gastrointestinal:  Negative for blood in stool, constipation, diarrhea, nausea and vomiting.   Endocrine: Negative for cold intolerance, heat intolerance and polyuria.  Genitourinary:  Negative for dyspareunia, dysuria, flank pain, frequency, genital sores, hematuria, menstrual problem, pelvic pain, urgency, vaginal bleeding, vaginal discharge and vaginal pain.  Musculoskeletal:  Negative for back pain, joint swelling and myalgias.  Skin:  Negative for rash.  Neurological:  Negative for dizziness, syncope, light-headedness, numbness and headaches.  Hematological:  Negative for adenopathy.  Psychiatric/Behavioral:  Negative for agitation, confusion, sleep disturbance and suicidal ideas. The patient is not nervous/anxious.      Objective: There were no vitals taken for this visit.   Physical Exam Constitutional:      Appearance: She is well-developed.  Genitourinary:     Vulva normal.     Right Labia: No rash, tenderness or lesions.    Left Labia: No tenderness, lesions or rash.    No vaginal discharge, erythema or tenderness.      Right Adnexa: not tender and no mass present.    Left Adnexa: not tender and no mass present.    No cervical friability or polyp.     Uterus is not enlarged or tender.  Breasts:    Right: No mass, nipple discharge, skin change or tenderness.     Left: No mass, nipple discharge, skin change or tenderness.  Neck:     Thyroid: No thyromegaly.  Cardiovascular:     Rate and Rhythm: Normal rate and regular rhythm.     Heart sounds: Normal heart sounds. No murmur heard. Pulmonary:     Effort: Pulmonary effort is normal.     Breath sounds: Normal breath sounds.  Abdominal:     Palpations: Abdomen is soft.     Tenderness: There is no abdominal tenderness. There is no guarding or rebound.  Musculoskeletal:        General: Normal range of motion.     Cervical back: Normal range of motion.  Lymphadenopathy:     Cervical: No cervical adenopathy.  Neurological:     General: No focal deficit present.     Mental Status: She is alert and oriented to  person, place, and time.     Cranial Nerves: No cranial nerve deficit.  Skin:    General: Skin is warm and dry.  Psychiatric:        Mood and Affect: Mood normal.        Behavior: Behavior normal.        Thought Content: Thought content normal.        Judgment: Judgment normal.  Vitals reviewed.     Assessment/Plan: Encounter for annual routine gynecological examination  Cervical cancer screening - Plan:  IGP, Aptima HPV   Screening for HPV (human papillomavirus) - Plan: IGP, Aptima HPV  Encounter for surveillance of injectable contraceptive - Plan: medroxyPROGESTERone  (DEPO-PROVERA ) injection 150 mg; Rx RF. Cont ca/add Vit D.  Encounter for screening mammogram for malignant neoplasm of breast - Plan: MM 3D SCREENING MAMMOGRAM BILATERAL BREAST; pt to schedule mammo  Family history of breast cancer - Plan: MM 3D SCREENING MAMMOGRAM BILATERAL BREAST; pt is MyRIsk neg  Increased risk of breast cancer - Plan: MM 3D SCREENING MAMMOGRAM BILATERAL BREAST; aware of recommendations of monthly SBE, yearly CBE and mammos, as well as scr breast MRI. Will call for MRI ref prn.   Encounter for management and injection of depo-Provera  - Plan: medroxyPROGESTERone  (DEPO-PROVERA ) injection 150 mg, medroxyPROGESTERone  (DEPO-PROVERA ) injection 150 mg  Cold sore - Plan: valACYclovir  (VALTREX ) 1000 MG tablet; Rx eRxd.   Blood tests for routine general physical examination - Plan: Comprehensive metabolic panel, Hemoglobin A1c, Lipid panel, VITAMIN D  25 Hydroxy (Vit-D Deficiency, Fractures)  Screening cholesterol level - Plan: Lipid panel  Encounter for vitamin deficiency screening - Plan: VITAMIN D  25 Hydroxy (Vit-D Deficiency, Fractures)  Screening for deficiency anemia  Screening for diabetes mellitus - Plan: Hemoglobin A1c  Will complete lab form and fax results.    No orders of the defined types were placed in this encounter.         GYN counsel breast self exam, mammography screening,  adequate intake of calcium and vitamin D , diet and exercise     F/U  No follow-ups on file.   Zonnique Norkus B. Elvia Aydin, PA-C 03/03/2024 4:53 PM

## 2024-03-05 ENCOUNTER — Encounter: Payer: Self-pay | Admitting: Obstetrics and Gynecology

## 2024-03-05 ENCOUNTER — Ambulatory Visit (INDEPENDENT_AMBULATORY_CARE_PROVIDER_SITE_OTHER): Admitting: Obstetrics and Gynecology

## 2024-03-05 VITALS — BP 118/76 | Ht 69.0 in | Wt 192.0 lb

## 2024-03-05 DIAGNOSIS — Z803 Family history of malignant neoplasm of breast: Secondary | ICD-10-CM

## 2024-03-05 DIAGNOSIS — Z9189 Other specified personal risk factors, not elsewhere classified: Secondary | ICD-10-CM

## 2024-03-05 DIAGNOSIS — B001 Herpesviral vesicular dermatitis: Secondary | ICD-10-CM

## 2024-03-05 DIAGNOSIS — Z1231 Encounter for screening mammogram for malignant neoplasm of breast: Secondary | ICD-10-CM

## 2024-03-05 DIAGNOSIS — Z124 Encounter for screening for malignant neoplasm of cervix: Secondary | ICD-10-CM

## 2024-03-05 DIAGNOSIS — Z3042 Encounter for surveillance of injectable contraceptive: Secondary | ICD-10-CM

## 2024-03-05 DIAGNOSIS — Z01419 Encounter for gynecological examination (general) (routine) without abnormal findings: Secondary | ICD-10-CM | POA: Diagnosis not present

## 2024-03-05 DIAGNOSIS — Z13220 Encounter for screening for lipoid disorders: Secondary | ICD-10-CM

## 2024-03-05 DIAGNOSIS — Z131 Encounter for screening for diabetes mellitus: Secondary | ICD-10-CM

## 2024-03-05 DIAGNOSIS — Z1321 Encounter for screening for nutritional disorder: Secondary | ICD-10-CM

## 2024-03-05 DIAGNOSIS — R87610 Atypical squamous cells of undetermined significance on cytologic smear of cervix (ASC-US): Secondary | ICD-10-CM | POA: Diagnosis not present

## 2024-03-05 DIAGNOSIS — Z0000 Encounter for general adult medical examination without abnormal findings: Secondary | ICD-10-CM

## 2024-03-05 DIAGNOSIS — Z1151 Encounter for screening for human papillomavirus (HPV): Secondary | ICD-10-CM

## 2024-03-05 DIAGNOSIS — L989 Disorder of the skin and subcutaneous tissue, unspecified: Secondary | ICD-10-CM

## 2024-03-05 MED ORDER — MEDROXYPROGESTERONE ACETATE 150 MG/ML IM SUSP: 150.0000 mg | INTRAMUSCULAR | Status: AC

## 2024-03-05 NOTE — Addendum Note (Signed)
 Addended by: WATT HILA B on: 03/05/2024 04:01 PM   Modules accepted: Orders

## 2024-03-05 NOTE — Patient Instructions (Signed)
 I value your feedback and you entrusting [place] with your care. If you get a [place] patient survey, I would appreciate you taking the time to let us know about your experience today. Thank you! ? ? ?

## 2024-03-06 ENCOUNTER — Ambulatory Visit: Payer: Self-pay | Admitting: Obstetrics and Gynecology

## 2024-03-06 DIAGNOSIS — R899 Unspecified abnormal finding in specimens from other organs, systems and tissues: Secondary | ICD-10-CM

## 2024-03-06 LAB — CBC WITH DIFFERENTIAL/PLATELET
Basophils Absolute: 0.1 x10E3/uL (ref 0.0–0.2)
Basos: 1 %
EOS (ABSOLUTE): 0.1 x10E3/uL (ref 0.0–0.4)
Eos: 1 %
Hematocrit: 44 % (ref 34.0–46.6)
Hemoglobin: 14.9 g/dL (ref 11.1–15.9)
Immature Grans (Abs): 0 x10E3/uL (ref 0.0–0.1)
Immature Granulocytes: 0 %
Lymphocytes Absolute: 3.9 x10E3/uL — ABNORMAL HIGH (ref 0.7–3.1)
Lymphs: 33 %
MCH: 33 pg (ref 26.6–33.0)
MCHC: 33.9 g/dL (ref 31.5–35.7)
MCV: 98 fL — ABNORMAL HIGH (ref 79–97)
Monocytes Absolute: 0.8 x10E3/uL (ref 0.1–0.9)
Monocytes: 7 %
Neutrophils Absolute: 7.2 x10E3/uL — ABNORMAL HIGH (ref 1.4–7.0)
Neutrophils: 58 %
Platelets: 359 x10E3/uL (ref 150–450)
RBC: 4.51 x10E6/uL (ref 3.77–5.28)
RDW: 12.6 % (ref 11.7–15.4)
WBC: 12.1 x10E3/uL — ABNORMAL HIGH (ref 3.4–10.8)

## 2024-03-06 LAB — COMPREHENSIVE METABOLIC PANEL WITH GFR
ALT: 10 IU/L (ref 0–32)
AST: 13 IU/L (ref 0–40)
Albumin: 4.7 g/dL (ref 3.9–4.9)
Alkaline Phosphatase: 68 IU/L (ref 44–121)
BUN/Creatinine Ratio: 16 (ref 9–23)
BUN: 13 mg/dL (ref 6–24)
Bilirubin Total: 0.4 mg/dL (ref 0.0–1.2)
CO2: 19 mmol/L — ABNORMAL LOW (ref 20–29)
Calcium: 9.3 mg/dL (ref 8.7–10.2)
Chloride: 103 mmol/L (ref 96–106)
Creatinine, Ser: 0.82 mg/dL (ref 0.57–1.00)
Globulin, Total: 2.2 g/dL (ref 1.5–4.5)
Glucose: 97 mg/dL (ref 70–99)
Potassium: 3.9 mmol/L (ref 3.5–5.2)
Sodium: 138 mmol/L (ref 134–144)
Total Protein: 6.9 g/dL (ref 6.0–8.5)
eGFR: 92 mL/min/1.73 (ref >59)

## 2024-03-06 LAB — VITAMIN D 25 HYDROXY (VIT D DEFICIENCY, FRACTURES): Vit D, 25-Hydroxy: 36.8 ng/mL (ref 30.0–100.0)

## 2024-03-06 LAB — LIPID PANEL
Chol/HDL Ratio: 3.6 ratio (ref 0.0–4.4)
Cholesterol, Total: 194 mg/dL (ref 100–199)
HDL: 54 mg/dL (ref >39)
LDL Chol Calc (NIH): 115 mg/dL — ABNORMAL HIGH (ref 0–99)
Triglycerides: 139 mg/dL (ref 0–149)
VLDL Cholesterol Cal: 25 mg/dL (ref 5–40)

## 2024-03-06 LAB — HEMOGLOBIN A1C
Est. average glucose Bld gHb Est-mCnc: 108 mg/dL
Hgb A1c MFr Bld: 5.4 % (ref 4.8–5.6)

## 2024-03-06 NOTE — Telephone Encounter (Signed)
 I called patient to let her know form has a place for her signature. She will sign it on her end and resend form for us  to fill out and fax.

## 2024-03-08 LAB — IGP, APTIMA HPV: HPV Aptima: NEGATIVE

## 2024-03-25 ENCOUNTER — Ambulatory Visit (INDEPENDENT_AMBULATORY_CARE_PROVIDER_SITE_OTHER)

## 2024-03-25 DIAGNOSIS — D2361 Other benign neoplasm of skin of right upper limb, including shoulder: Secondary | ICD-10-CM | POA: Diagnosis not present

## 2024-03-25 DIAGNOSIS — D485 Neoplasm of uncertain behavior of skin: Secondary | ICD-10-CM

## 2024-03-25 NOTE — Progress Notes (Signed)
    Subjective   Lynn Nguyen is a 43 y.o. female who presents for the following: Lesion(s) of concern . Patient is new patient  Today patient reports: Present for at least a year and a half. Patient thought it was an insect bite but it never went away. No itching or tenderness, not sore. Has not changed.   Review of Systems:    No other skin or systemic complaints except as noted in HPI or Assessment and Plan.  The following portions of the chart were reviewed this encounter and updated as appropriate: medications, allergies, medical history  Relevant Medical History:  Family history of skin cancer - mother, unsure what type   Objective  Well appearing patient in no apparent distress; mood and affect are within normal limits. Examination was performed of the: Focused Exam of: right arm   Examination notable for below  Examination limited by:  n/a   Right Forearm - Anterior 8 mm firm subcutaneous papule Cyst vs Scar/persistent bite reaction vs NMSC vs DFSP vs other  Assessment & Plan    Procedures, orders, diagnosis for this visit:  NEOPLASM OF UNCERTAIN BEHAVIOR OF SKIN Right Forearm - Anterior Skin / nail biopsy Type of biopsy: punch   Informed consent: discussed and consent obtained   Timeout: patient name, date of birth, surgical site, and procedure verified   Procedure prep:  Patient was prepped and draped in usual sterile fashion Prep type:  Isopropyl alcohol Anesthesia: the lesion was anesthetized in a standard fashion   Anesthetic:  1% lidocaine  w/ epinephrine  1-100,000 buffered w/ 8.4% NaHCO3 Punch size:  4 mm Suture size:  4-0 Suture type: Prolene (polypropylene)   Suture removal (days):  7 Hemostasis achieved with: suture, pressure and aluminum chloride   Outcome: patient tolerated procedure well   Post-procedure details: sterile dressing applied and wound care instructions given   Dressing type: bandage and petrolatum     Neoplasm of uncertain  behavior of skin -     Skin / nail biopsy    Return to clinic: Return for 7-10 days , with Dr. Raymund, Suture Removal.  Lynn Nguyen, RMA, am acting as scribe for Lauraine JAYSON Raymund, MD .   Documentation: I have reviewed the above documentation for accuracy and completeness, and I agree with the above.  Lauraine JAYSON Raymund, MD

## 2024-03-25 NOTE — Patient Instructions (Signed)

## 2024-04-05 LAB — ANATOMIC PATHOLOGY REPORT

## 2024-04-05 LAB — SPECIMEN STATUS REPORT

## 2024-04-06 ENCOUNTER — Ambulatory Visit: Payer: Self-pay

## 2024-04-08 NOTE — Progress Notes (Signed)
Patient informed and voiced good understanding.

## 2024-04-26 ENCOUNTER — Ambulatory Visit

## 2024-04-26 VITALS — BP 117/72 | HR 90 | Resp 16 | Ht 69.0 in | Wt 187.7 lb

## 2024-04-26 DIAGNOSIS — Z3042 Encounter for surveillance of injectable contraceptive: Secondary | ICD-10-CM

## 2024-04-26 MED ORDER — MEDROXYPROGESTERONE ACETATE 150 MG/ML IM SUSP
150.0000 mg | Freq: Once | INTRAMUSCULAR | Status: AC
  Administered 2024-04-26: 150 mg via INTRAMUSCULAR

## 2024-04-26 NOTE — Patient Instructions (Addendum)

## 2024-04-26 NOTE — Progress Notes (Signed)
    NURSE VISIT NOTE  Subjective:    Patient ID: Lynn Nguyen, female    DOB: [birth year], 43 y.o.   MRN: [id number]  HPI  Patient is a 43 y.o. G65P2002 female who presents for depo provera  injection.   Objective:    BP 117/72   Pulse 90   Resp 16   Ht 5' 9 (1.753 m)   Wt 187 lb 11.2 oz (85.1 kg)   BMI 27.72 kg/m   Last Annual: 03/05/2024. Last pap: 02/27/2023. Last Depo-Provera : 02/02/2024. Side Effects if any: none. Serum HCG indicated? No . Depo-Provera  150 mg IM given by: Camelia Fetters, CMA. Site: Right Upper Outer Quandrant  Lab Review  No results found for any visits on 04/26/24.  Assessment:   1. Encounter for surveillance of injectable contraceptive      Plan:   Next appointment due between Jan 2 and Jan 16.   Camelia Fetters, CMA [facility]

## 2024-05-17 ENCOUNTER — Other Ambulatory Visit: Payer: Self-pay | Admitting: Obstetrics and Gynecology

## 2024-05-17 ENCOUNTER — Encounter: Payer: Self-pay | Admitting: Obstetrics and Gynecology

## 2024-05-17 DIAGNOSIS — L0231 Cutaneous abscess of buttock: Secondary | ICD-10-CM

## 2024-05-17 MED ORDER — DOXYCYCLINE HYCLATE 100 MG PO CAPS: 100.0000 mg | ORAL_CAPSULE | Freq: Two times a day (BID) | ORAL | 0 refills | Status: AC

## 2024-05-17 NOTE — Progress Notes (Signed)
 Rx doxy for abscess on buttocks, last sx 12/21

## 2024-07-19 ENCOUNTER — Ambulatory Visit

## 2024-07-19 NOTE — Progress Notes (Unsigned)
" ° ° °  NURSE VISIT NOTE  Subjective:    Patient ID: Lynn Nguyen, female    DOB: 1981/02/28, 44 y.o.   MRN: [id number]  HPI  Patient is a 44 y.o. G16P2002 female who presents for depo provera  injection.   Objective:    There were no vitals taken for this visit.  Last Annual: 03/05/2024. Last pap: 02/27/2023. Last Depo-Provera : 04/26/2024. Side Effects if any: none. Serum HCG indicated? No . Depo-Provera  150 mg IM given by: Camelia Fetters, CMA. Site: Left Upper Outer Quandrant  Lab Review  No results found for any visits on 07/19/24.  Assessment:   1. Encounter for surveillance of injectable contraceptive      Plan:   Next appointment due between March 27 and April 10.   Camelia Fetters, CMA [facility]

## 2024-07-23 ENCOUNTER — Ambulatory Visit (INDEPENDENT_AMBULATORY_CARE_PROVIDER_SITE_OTHER): Payer: Self-pay

## 2024-07-23 VITALS — BP 114/75 | HR 82 | Ht 68.0 in | Wt 190.1 lb

## 2024-07-23 DIAGNOSIS — Z3042 Encounter for surveillance of injectable contraceptive: Secondary | ICD-10-CM

## 2024-07-23 MED ORDER — MEDROXYPROGESTERONE ACETATE 150 MG/ML IM SUSY
150.0000 mg | PREFILLED_SYRINGE | Freq: Once | INTRAMUSCULAR | Status: AC
  Administered 2024-07-23: 150 mg via INTRAMUSCULAR

## 2024-07-23 NOTE — Progress Notes (Signed)
" ° ° °  NURSE VISIT NOTE  Subjective:    Patient ID: WRENLEY SAYED, female    DOB: 09/15/1980, 44 y.o.   MRN: [id number]  HPI  Patient is a 44 y.o. G77P2002 female who presents for depo provera  injection.   Objective:    BP 114/75   Pulse 82   Ht 5' 8 (1.727 m)   Wt 190 lb 1.6 oz (86.2 kg)   BMI 28.90 kg/m   Last Annual: 03/05/2024. Last pap: 03/05/2024. Last Depo-Provera : 04/26/2024. Side Effects if any: None Serum HCG indicated? No . Depo-Provera  150 mg IM given by: Burnard Ro, CMA. Site: Left Upper Outer Quandrant    Assessment:   1. Encounter for surveillance of injectable contraceptive      Plan:   Next appointment due between March 31st and April 14th.    Burnard LITTIE Ro, CMA  "

## 2024-10-15 ENCOUNTER — Ambulatory Visit: Payer: Self-pay
